# Patient Record
Sex: Male | Born: 1985 | Race: Black or African American | Hispanic: No | Marital: Single | Smoking: Former smoker
Health system: Southern US, Community
[De-identification: ages and names within clinical notes are randomized; demographics above are authoritative.]

## PROBLEM LIST (undated history)

## (undated) DIAGNOSIS — F410 Panic disorder [episodic paroxysmal anxiety] without agoraphobia: Secondary | ICD-10-CM

## (undated) DIAGNOSIS — F419 Anxiety disorder, unspecified: Secondary | ICD-10-CM

## (undated) HISTORY — PX: OTHER SURGICAL HISTORY: SHX169

---

## 2009-09-07 ENCOUNTER — Emergency Department (HOSPITAL_COMMUNITY): Admission: EM | Admit: 2009-09-07 | Discharge: 2009-09-07 | Payer: Self-pay | Admitting: Emergency Medicine

## 2009-09-08 ENCOUNTER — Emergency Department (HOSPITAL_COMMUNITY): Admission: EM | Admit: 2009-09-08 | Discharge: 2009-09-08 | Payer: Self-pay | Admitting: Family Medicine

## 2012-10-01 ENCOUNTER — Encounter (HOSPITAL_COMMUNITY): Payer: Self-pay | Admitting: Family Medicine

## 2012-10-01 ENCOUNTER — Emergency Department (HOSPITAL_COMMUNITY)
Admission: EM | Admit: 2012-10-01 | Discharge: 2012-10-01 | Disposition: A | Discharge status: Home / Self Care | Payer: Self-pay | Attending: Emergency Medicine | Admitting: Emergency Medicine

## 2012-10-01 DIAGNOSIS — K089 Disorder of teeth and supporting structures, unspecified: Secondary | ICD-10-CM | POA: Insufficient documentation

## 2012-10-01 DIAGNOSIS — R220 Localized swelling, mass and lump, head: Secondary | ICD-10-CM | POA: Insufficient documentation

## 2012-10-01 DIAGNOSIS — K029 Dental caries, unspecified: Secondary | ICD-10-CM | POA: Insufficient documentation

## 2012-10-01 DIAGNOSIS — K0889 Other specified disorders of teeth and supporting structures: Secondary | ICD-10-CM

## 2012-10-01 DIAGNOSIS — R6884 Jaw pain: Secondary | ICD-10-CM | POA: Insufficient documentation

## 2012-10-01 DIAGNOSIS — F17200 Nicotine dependence, unspecified, uncomplicated: Secondary | ICD-10-CM | POA: Insufficient documentation

## 2012-10-01 MED ORDER — AMOXICILLIN 500 MG PO CAPS
500.0000 mg | ORAL_CAPSULE | Freq: Once | ORAL | Status: AC
  Administered 2012-10-01: 500 mg via ORAL
  Filled 2012-10-01: qty 1

## 2012-10-01 MED ORDER — TRAMADOL HCL 50 MG PO TABS: 50.0000 mg | ORAL_TABLET | Freq: Four times a day (QID) | ORAL | Status: DC | PRN

## 2012-10-01 MED ORDER — AMOXICILLIN 500 MG PO CAPS: 500.0000 mg | ORAL_CAPSULE | Freq: Three times a day (TID) | ORAL | Status: DC

## 2012-10-01 MED ORDER — TRAMADOL HCL 50 MG PO TABS
50.0000 mg | ORAL_TABLET | Freq: Once | ORAL | Status: AC
  Administered 2012-10-01: 50 mg via ORAL
  Filled 2012-10-01: qty 1

## 2012-10-01 NOTE — ED Notes (Addendum)
Patient states that he has had a toothache for about 3 days. States that he has not seen a dentist because his insurance lapsed.

## 2012-10-01 NOTE — ED Provider Notes (Signed)
History     CSN: [id number]  Arrival date & time 10/01/12  9562   First MD Initiated Contact with Patient 10/01/12 [phone]      No chief complaint on file.   (Consider location/radiation/quality/duration/timing/severity/associated sxs/prior treatment) HPI  Patient presents to the emergency department with a dental complaint. Symptoms began 5 days ago after he chipped his tooth while eating. Then 3 days ago he began to have swelling and pain. The patient has tried to alleviate pain with OTC.  Pain rated at a 10/10, characterized as throbbing in nature and located right lower jaw. Patient denies fever, night sweats, chills, difficulty swallowing or opening mouth, SOB, nuchal rigidity or decreased ROM of neck.  Patient does not have a dentist and requests a resource guide at discharge.   No past medical history on file.  No past surgical history on file.  No family history on file.  History  Substance Use Topics  . Smoking status: Not on file  . Smokeless tobacco: Not on file  . Alcohol Use: Not on file      Review of Systems  All other systems reviewed and are negative.    Allergies  Review of patient's allergies indicates not on file.  Home Medications   Current Outpatient Rx  Name  Route  Sig  Dispense  Refill  . amoxicillin (AMOXIL) 500 MG capsule   Oral   Take 1 capsule (500 mg total) by mouth 3 (three) times daily.   21 capsule   0   . traMADol (ULTRAM) 50 MG tablet   Oral   Take 1 tablet (50 mg total) by mouth every 6 (six) hours as needed for pain.   15 tablet   0     BP 134/75  Pulse 55  Temp(Src) 97.4 F (36.3 C) (Oral)  Resp 18  SpO2 100%  Physical Exam  Nursing note and vitals reviewed. Constitutional: He appears well-developed and well-nourished.  HENT:  Head: Normocephalic and atraumatic.  Mouth/Throat: Dental caries present.    Eyes: Conjunctivae and EOM are normal. Pupils are equal, round, and reactive to light.  Neck: Normal range  of motion. Neck supple.  Cardiovascular: Normal rate and regular rhythm.   Pulmonary/Chest: Effort normal and breath sounds normal.    ED Course  Procedures (including critical care time)  Labs Reviewed - No data to display No results found.   1. Pain, dental       MDM  He declines any narcotic pain medication for fear of getting addicted but accept Ultram. Wants antibiotics. Will give abx and referral to dentist and discussed 24-48 hour rule for f/up with him.  Pt has been advised of the symptoms that warrant their return to the ED. Patient has voiced understanding and has agreed to follow-up with the PCP or specialist.          Dorthula Matas, PA 10/01/12 [phone]

## 2012-10-01 NOTE — ED Provider Notes (Signed)
Medical screening examination/treatment/procedure(s) were performed by non-physician practitioner and as supervising physician I was immediately available for consultation/collaboration.  Olivia Mackie, MD 10/01/12 [phone]

## 2012-11-25 ENCOUNTER — Emergency Department (HOSPITAL_COMMUNITY)
Admission: EM | Admit: 2012-11-25 | Discharge: 2012-11-25 | Disposition: A | Discharge status: Home / Self Care | Payer: Self-pay | Attending: Emergency Medicine | Admitting: Emergency Medicine

## 2012-11-25 ENCOUNTER — Encounter (HOSPITAL_COMMUNITY): Payer: Self-pay | Admitting: *Deleted

## 2012-11-25 ENCOUNTER — Emergency Department (HOSPITAL_COMMUNITY): Payer: Self-pay

## 2012-11-25 DIAGNOSIS — J329 Chronic sinusitis, unspecified: Secondary | ICD-10-CM | POA: Insufficient documentation

## 2012-11-25 DIAGNOSIS — R05 Cough: Secondary | ICD-10-CM | POA: Insufficient documentation

## 2012-11-25 DIAGNOSIS — R0982 Postnasal drip: Secondary | ICD-10-CM | POA: Insufficient documentation

## 2012-11-25 DIAGNOSIS — J019 Acute sinusitis, unspecified: Secondary | ICD-10-CM

## 2012-11-25 DIAGNOSIS — R0602 Shortness of breath: Secondary | ICD-10-CM | POA: Insufficient documentation

## 2012-11-25 DIAGNOSIS — J069 Acute upper respiratory infection, unspecified: Secondary | ICD-10-CM

## 2012-11-25 DIAGNOSIS — R059 Cough, unspecified: Secondary | ICD-10-CM | POA: Insufficient documentation

## 2012-11-25 DIAGNOSIS — J3489 Other specified disorders of nose and nasal sinuses: Secondary | ICD-10-CM | POA: Insufficient documentation

## 2012-11-25 DIAGNOSIS — J029 Acute pharyngitis, unspecified: Secondary | ICD-10-CM | POA: Insufficient documentation

## 2012-11-25 DIAGNOSIS — F17200 Nicotine dependence, unspecified, uncomplicated: Secondary | ICD-10-CM | POA: Insufficient documentation

## 2012-11-25 MED ORDER — HYDROCOD POLST-CHLORPHEN POLST 10-8 MG/5ML PO LQCR: 5.0000 mL | Freq: Two times a day (BID) | ORAL | Status: DC | PRN

## 2012-11-25 MED ORDER — AZITHROMYCIN 250 MG PO TABS: 250.0000 mg | ORAL_TABLET | Freq: Every day | ORAL | Status: DC

## 2012-11-25 NOTE — ED Notes (Signed)
XR at bedside

## 2012-11-25 NOTE — ED Notes (Signed)
Pt from home with reports of chest pain, cough, nasal congestion and shortness of breath for the last week.

## 2012-11-25 NOTE — ED Provider Notes (Signed)
History     CSN: [id number]  Arrival date & time 11/25/12  0800   First MD Initiated Contact with Patient 11/25/12 [phone]      Chief Complaint  Patient presents with  . Chest Pain  . Cough  . Nasal Congestion    (Consider location/radiation/quality/duration/timing/severity/associated sxs/prior treatment) HPI Comments: Patient presents with nasal congestion, sinus pressure, post nasal drip and cough for the past week.  Symptoms gradually worsening.  He has had some associated chest pain for the past 2-3 days intermittently, but only after coughing really hard.  He also reports intermittent shortness of breath.  He is not feeling short of breath at this time.  He has taken Mucinex and Claritin for his symptoms without relief.  No history of Asthma.  He currently smokes occasional cigars.  He denies fever or chills.  No nausea, vomiting, or diarrhea.  Denies dizziness, lightheadedness, or syncope.    The history is provided by the patient.    History reviewed. No pertinent past medical history.  History reviewed. No pertinent past surgical history.  History reviewed. No pertinent family history.  History  Substance Use Topics  . Smoking status: Current Some Day Smoker    Types: Cigars  . Smokeless tobacco: Never Used  . Alcohol Use: Yes     Comment: Socially      Review of Systems  Constitutional: Negative for fever and chills.  HENT: Positive for congestion, sore throat, rhinorrhea, postnasal drip and sinus pressure. Negative for ear pain, neck pain and neck stiffness.   Respiratory: Positive for cough and shortness of breath.   Gastrointestinal: Negative for nausea and vomiting.  All other systems reviewed and are negative.    Allergies  Review of patient's allergies indicates no known allergies.  Home Medications   Current Outpatient Rx  Name  Route  Sig  Dispense  Refill  . amoxicillin (AMOXIL) 500 MG capsule   Oral   Take 1 capsule (500 mg total) by mouth 3  (three) times daily.   21 capsule   0   . traMADol (ULTRAM) 50 MG tablet   Oral   Take 1 tablet (50 mg total) by mouth every 6 (six) hours as needed for pain.   15 tablet   0     BP 140/88  Pulse 60  Temp(Src) 98 F (36.7 C) (Oral)  Resp 16  Wt 206 lb (93.441 kg)  SpO2 100%  Physical Exam  Nursing note and vitals reviewed. Constitutional: He appears well-developed and well-nourished.  HENT:  Head: Normocephalic and atraumatic.  Right Ear: Tympanic membrane and ear canal normal.  Left Ear: Tympanic membrane and ear canal normal.  Nose: Mucosal edema and rhinorrhea present. Right sinus exhibits maxillary sinus tenderness. Right sinus exhibits no frontal sinus tenderness. Left sinus exhibits maxillary sinus tenderness. Left sinus exhibits no frontal sinus tenderness.  Mouth/Throat: Uvula is midline, oropharynx is clear and moist and mucous membranes are normal.  Neck: Normal range of motion. Neck supple.  Cardiovascular: Normal rate, regular rhythm and normal heart sounds.   Pulmonary/Chest: Effort normal and breath sounds normal. No respiratory distress. He has no wheezes. He has no rales. He exhibits tenderness.  Neurological: He is alert.  Skin: Skin is warm and dry.  Psychiatric: He has a normal mood and affect.    ED Course  Procedures (including critical care time)  Labs Reviewed  CBC  BASIC METABOLIC PANEL   Dg Chest Port 1 View  11/25/2012  *RADIOLOGY REPORT*  Clinical Data: Chest pain.  Cough.  PORTABLE CHEST - 1 VIEW  Comparison: 09/07/2009.  Findings: Heart size upper limits of normal for projection.  No airspace disease.  No effusion. No pneumothorax. Monitoring leads are projected over the chest.  IMPRESSION: No acute cardiopulmonary disease.   Original Report Authenticated By: Andreas Newport, M.D.      No diagnosis found.   Date: 11/25/2012  Rate: 56  Rhythm: sinus bradycardia  QRS Axis: normal  Intervals: normal  ST/T Wave abnormalities: normal   Conduction Disutrbances:none  Narrative Interpretation:   Old EKG Reviewed: none available    MDM  Pt CXR negative for acute infiltrate. Patients symptoms are consistent with URI, likely viral etiology. Patient also with sinus tenderness consistent with Acute Sinusitis.  Patient discharged home with antibiotic to treat Sinusitis and cough suppressant.  Patient verbalizes understanding and is agreeable with plan. Pt is hemodynamically stable & in NAD prior to dc.        Pascal Lux [place], PA-C 11/25/12 [phone]

## 2012-11-25 NOTE — ED Provider Notes (Signed)
Medical screening examination/treatment/procedure(s) were performed by non-physician practitioner and as supervising physician I was immediately available for consultation/collaboration.  Flint Melter, MD 11/25/12 1053

## 2013-06-18 ENCOUNTER — Encounter (HOSPITAL_COMMUNITY): Payer: Self-pay | Admitting: Emergency Medicine

## 2013-06-18 ENCOUNTER — Emergency Department (HOSPITAL_COMMUNITY)
Admission: EM | Admit: 2013-06-18 | Discharge: 2013-06-18 | Disposition: A | Discharge status: Home / Self Care | Payer: Managed Care, Other (non HMO) | Attending: Emergency Medicine | Admitting: Emergency Medicine

## 2013-06-18 DIAGNOSIS — X500XXA Overexertion from strenuous movement or load, initial encounter: Secondary | ICD-10-CM | POA: Insufficient documentation

## 2013-06-18 DIAGNOSIS — Y9389 Activity, other specified: Secondary | ICD-10-CM | POA: Insufficient documentation

## 2013-06-18 DIAGNOSIS — F17200 Nicotine dependence, unspecified, uncomplicated: Secondary | ICD-10-CM | POA: Insufficient documentation

## 2013-06-18 DIAGNOSIS — Z79899 Other long term (current) drug therapy: Secondary | ICD-10-CM | POA: Insufficient documentation

## 2013-06-18 DIAGNOSIS — Y9289 Other specified places as the place of occurrence of the external cause: Secondary | ICD-10-CM | POA: Insufficient documentation

## 2013-06-18 DIAGNOSIS — IMO0002 Reserved for concepts with insufficient information to code with codable children: Secondary | ICD-10-CM | POA: Insufficient documentation

## 2013-06-18 DIAGNOSIS — M549 Dorsalgia, unspecified: Secondary | ICD-10-CM

## 2013-06-18 DIAGNOSIS — Y990 Civilian activity done for income or pay: Secondary | ICD-10-CM | POA: Insufficient documentation

## 2013-06-18 MED ORDER — MELOXICAM 7.5 MG PO TABS: 15.0000 mg | ORAL_TABLET | Freq: Every day | ORAL | Status: DC

## 2013-06-18 MED ORDER — CYCLOBENZAPRINE HCL 10 MG PO TABS: 10.0000 mg | ORAL_TABLET | Freq: Two times a day (BID) | ORAL | Status: DC | PRN

## 2013-06-18 MED ORDER — OXYCODONE-ACETAMINOPHEN 5-325 MG PO TABS
2.0000 | ORAL_TABLET | Freq: Once | ORAL | Status: AC
  Administered 2013-06-18: 2 via ORAL
  Filled 2013-06-18: qty 2

## 2013-06-18 NOTE — ED Notes (Signed)
Pt states that he lifted something heavy at work and heard a pop in his lower back

## 2013-06-18 NOTE — ED Provider Notes (Signed)
Medical screening examination/treatment/procedure(s) were performed by non-physician practitioner and as supervising physician I was immediately available for consultation/collaboration.    Sunnie Nielsen, MD 06/18/13 [phone]

## 2013-06-18 NOTE — ED Provider Notes (Signed)
CSN: [id number]     Arrival date & time 06/18/13  4098 History   First MD Initiated Contact with Patient 06/18/13 [phone]     Chief Complaint  Patient presents with  . Back Pain   (Consider location/radiation/quality/duration/timing/severity/associated sxs/prior Treatment) HPI Comments: Patient is a 27 year old male with no significant past medical history who presents for low back pain with onset 1.5 days ago. Patient states that he was lifting packages at work when he heard a pop in his back followed by a pain. Patient states the pain has been worsening since onset and is intermittent. He denies trying anything or taking any over-the-counter medication for his symptoms. Patient states, "all I tried is lying down". Patient denies associated fever, saddle anesthesia, perianal numbness, bowel or bladder incontinence, numbness or weakness in his lower extremities, and an inability to ambulate.  The history is provided by the patient. No language interpreter was used.    History reviewed. No pertinent past medical history. History reviewed. No pertinent past surgical history. No family history on file. History  Substance Use Topics  . Smoking status: Current Some Day Smoker    Types: Cigars  . Smokeless tobacco: Never Used  . Alcohol Use: Yes     Comment: Socially    Review of Systems  Musculoskeletal: Positive for back pain.  All other systems reviewed and are negative.    Allergies  Review of patient's allergies indicates no known allergies.  Home Medications   Current Outpatient Rx  Name  Route  Sig  Dispense  Refill  . cyclobenzaprine (FLEXERIL) 10 MG tablet   Oral   Take 1 tablet (10 mg total) by mouth 2 (two) times daily as needed for muscle spasms.   20 tablet   0   . meloxicam (MOBIC) 7.5 MG tablet   Oral   Take 2 tablets (15 mg total) by mouth daily.   30 tablet   0    BP 121/71  Pulse 71  Temp(Src) 97.8 F (36.6 C) (Oral)  Resp 18  SpO2 98%  Physical  Exam  Nursing note and vitals reviewed. Constitutional: He is oriented to person, place, and time. He appears well-developed and well-nourished. No distress.  HENT:  Head: Normocephalic and atraumatic.  Eyes: Conjunctivae and EOM are normal. No scleral icterus.  Neck: Normal range of motion.  Cardiovascular: Normal rate, regular rhythm and intact distal pulses.   Pulmonary/Chest: Effort normal. No respiratory distress.  Musculoskeletal: Normal range of motion. He exhibits tenderness.  Tenderness to palpation of bilateral lumbosacral paraspinal muscles. No bony deformities or step-offs palpated. Normal range of motion of back appreciated.  Neurological: He is alert and oriented to person, place, and time.  No sensory or motor deficits appreciated. Patient moves extremities without ataxia and is ambulatory with normal gait.  Skin: Skin is warm and dry. No rash noted. He is not diaphoretic. No erythema. No pallor.  Psychiatric: He has a normal mood and affect. His behavior is normal.    ED Course  Procedures (including critical care time) Labs Review Labs Reviewed - No data to display Imaging Review No results found.  EKG Interpretation   None       MDM   1. Back pain    Uncomplicated back pain. Patient neurovascularly intact and ambulatory with normal gait. No bony deformities or step-offs palpated. Patient without red flags or signs concerning for cauda equina. He is afebrile and hemodynamically stable today. Patient's pain treated in ED with Percocet. He is  stable for discharge with prescriptions for Mobic and Flexeril and instruction to ice the area. Referral to orthopedics provided to symptoms persist. Return precautions discussed and patient agreeable to plan with no unaddressed concerns.    Antony Madura, PA-C 06/18/13 [phone]

## 2013-06-18 NOTE — ED Notes (Signed)
Pt lifting packages at work tonight and felt like he twisted back; pain getting worse

## 2013-06-18 NOTE — Discharge Instructions (Signed)
Recommend Mobic and Flexeril as prescribed for your symptoms. Also recommend ice to your back at least 4 times a day for at least 30 minutes each time for the next 72 hours. Refrain from strenuous activity and heavy lifting for the next 7 days. Follow up with orthopedics if symptoms persist. Return if symptoms worsen.  Back Pain, Adult Low back pain is very common. About 1 in 5 people have back pain.The cause of low back pain is rarely dangerous. The pain often gets better over time.About half of people with a sudden onset of back pain feel better in just 2 weeks. About 8 in 10 people feel better by 6 weeks.  CAUSES Some common causes of back pain include:  Strain of the muscles or ligaments supporting the spine.  Wear and tear (degeneration) of the spinal discs.  Arthritis.  Direct injury to the back. DIAGNOSIS Most of the time, the direct cause of low back pain is not known.However, back pain can be treated effectively even when the exact cause of the pain is unknown.Answering your caregiver's questions about your overall health and symptoms is one of the most accurate ways to make sure the cause of your pain is not dangerous. If your caregiver needs more information, he or she may order lab work or imaging tests (X-rays or MRIs).However, even if imaging tests show changes in your back, this usually does not require surgery. HOME CARE INSTRUCTIONS For many people, back pain returns.Since low back pain is rarely dangerous, it is often a condition that people can learn to [facility] their own.   Remain active. It is stressful on the back to sit or stand in one place. Do not sit, drive, or stand in one place for more than 30 minutes at a time. Take short walks on level surfaces as soon as pain allows.Try to increase the length of time you walk each day.  Do not stay in bed.Resting more than 1 or 2 days can delay your recovery.  Do not avoid exercise or work.Your body is made to move.It  is not dangerous to be active, even though your back may hurt.Your back will likely heal faster if you return to being active before your pain is gone.  Pay attention to your body when you bend and lift. Many people have less discomfortwhen lifting if they bend their knees, keep the load close to their bodies,and avoid twisting. Often, the most comfortable positions are those that put less stress on your recovering back.  Find a comfortable position to sleep. Use a firm mattress and lie on your side with your knees slightly bent. If you lie on your back, put a pillow under your knees.  Only take over-the-counter or prescription medicines as directed by your caregiver. Over-the-counter medicines to reduce pain and inflammation are often the most helpful.Your caregiver may prescribe muscle relaxant drugs.These medicines help dull your pain so you can more quickly return to your normal activities and healthy exercise.  Put ice on the injured area.  Put ice in a plastic bag.  Place a towel between your skin and the bag.  Leave the ice on for 15-20 minutes, 03-04 times a day for the first 2 to 3 days. After that, ice and heat may be alternated to reduce pain and spasms.  Ask your caregiver about trying back exercises and gentle massage. This may be of some benefit.  Avoid feeling anxious or stressed.Stress increases muscle tension and can worsen back pain.It is important to  recognize when you are anxious or stressed and learn ways to manage it.Exercise is a great option. SEEK MEDICAL CARE IF:  You have pain that is not relieved with rest or medicine.  You have pain that does not improve in 1 week.  You have new symptoms.  You are generally not feeling well. SEEK IMMEDIATE MEDICAL CARE IF:   You have pain that radiates from your back into your legs.  You develop new bowel or bladder control problems.  You have unusual weakness or numbness in your arms or legs.  You develop  nausea or vomiting.  You develop abdominal pain.  You feel faint. Document Released: 07/22/2005 Document Revised: 01/21/2012 Document Reviewed: 12/10/2010 [facility] Patient Information 2014 [place], [place].

## 2013-06-19 ENCOUNTER — Emergency Department (HOSPITAL_COMMUNITY)
Admission: EM | Admit: 2013-06-19 | Discharge: 2013-06-19 | Disposition: A | Discharge status: Home / Self Care | Payer: Managed Care, Other (non HMO) | Attending: Emergency Medicine | Admitting: Emergency Medicine

## 2013-06-19 ENCOUNTER — Encounter (HOSPITAL_COMMUNITY): Payer: Self-pay | Admitting: Emergency Medicine

## 2013-06-19 DIAGNOSIS — F17200 Nicotine dependence, unspecified, uncomplicated: Secondary | ICD-10-CM | POA: Insufficient documentation

## 2013-06-19 DIAGNOSIS — R51 Headache: Secondary | ICD-10-CM | POA: Insufficient documentation

## 2013-06-19 DIAGNOSIS — M545 Low back pain, unspecified: Secondary | ICD-10-CM | POA: Insufficient documentation

## 2013-06-19 DIAGNOSIS — IMO0001 Reserved for inherently not codable concepts without codable children: Secondary | ICD-10-CM | POA: Insufficient documentation

## 2013-06-19 DIAGNOSIS — Z79899 Other long term (current) drug therapy: Secondary | ICD-10-CM | POA: Insufficient documentation

## 2013-06-19 NOTE — ED Notes (Signed)
Per pt report: pt was seen here on Thursday and was given a prescription for pain medication that he cannot take and work.  Pt originally injured back from picking up heavy boxes.  Pt reports still in pain.

## 2013-06-19 NOTE — ED Provider Notes (Signed)
CSN: [id number]     Arrival date & time 06/19/13  2210 History  This chart was scribed for non-physician practitioner Trixie Dredge, PA-C,  working with Celene Kras, MD, by Yevette Edwards, ED Scribe. This patient was seen in room WTR9/WTR9 and the patient's care was started at 10:42 PM. First MD Initiated Contact with Patient 06/19/13 2237     Chief Complaint  Patient presents with  . Back Pain    Patient is a 27 y.o. male presenting with back pain. The history is provided by the patient. No language interpreter was used.  Back Pain Location:  Lumbar spine Radiates to:  Does not radiate Pain severity:  Moderate Onset quality:  Sudden Duration:  3 days Timing:  Constant Progression:  Unchanged Chronicity:  Recurrent Context: lifting heavy objects   Associated symptoms: headaches   Associated symptoms: no fever, no numbness and no weakness    HPI Comments: Todd Riggs is a 27 y.o. male who presents to the Emergency Department complaining of recurrent middle-back pain which occurred two days ago when he picked up a heavy box and heard a "pop." After the incident, the pt was treated for the back pain at the ED and he was prescribed medication. He reports the medication causes drowsiness. The pt returned to work today where he experienced increased pain; he requests a work note in order to give his back time to heal. He states that he has experienced intermittent paresthesia to his left leg. He denies any weakness to his legs. He also denies any urinary incontinence, bowel incontinence, difficulty ambulating, or a fever.   History reviewed. No pertinent past medical history. History reviewed. No pertinent past surgical history. No family history on file. History  Substance Use Topics  . Smoking status: Current Some Day Smoker    Types: Cigars  . Smokeless tobacco: Never Used  . Alcohol Use: Yes     Comment: Socially    Review of Systems  Constitutional: Negative for fever.   Genitourinary: Negative for urgency.  Musculoskeletal: Positive for back pain and myalgias. Negative for gait problem.  Neurological: Positive for headaches. Negative for weakness and numbness.  All other systems reviewed and are negative.    Allergies  Review of patient's allergies indicates no known allergies.  Home Medications   Current Outpatient Rx  Name  Route  Sig  Dispense  Refill  . cyclobenzaprine (FLEXERIL) 10 MG tablet   Oral   Take 1 tablet (10 mg total) by mouth 2 (two) times daily as needed for muscle spasms.   20 tablet   0   . meloxicam (MOBIC) 7.5 MG tablet   Oral   Take 2 tablets (15 mg total) by mouth daily.   30 tablet   0    Triage Vitals: BP 120/71  Pulse 74  Temp(Src) 98.2 F (36.8 C) (Oral)  Resp 18  Ht 6\' 2"  (1.88 m)  Wt 216 lb (97.977 kg)  BMI 27.72 kg/m2  SpO2 99%  Physical Exam  Nursing note and vitals reviewed. Constitutional: He appears well-developed and well-nourished. No distress.  HENT:  Head: Normocephalic and atraumatic.  Neck: Neck supple.  Pulmonary/Chest: Effort normal.  Abdominal: Soft. There is no tenderness.  Musculoskeletal: Normal range of motion. He exhibits tenderness. He exhibits no edema.       Arms: Spine nontender, no crepitus, or stepoffs. Lower extremities:  Strength 5/5, sensation intact, distal pulses intact.      Neurological: He is alert.  Skin: He  is not diaphoretic.    ED Course  Procedures (including critical care time)  DIAGNOSTIC STUDIES: Oxygen Saturation is 99% on room air, normal by my interpretation.    COORDINATION OF CARE:  10:48 PM- Discussed treatment plan with patient, and the patient agreed to the plan.   Labs Review Labs Reviewed - No data to display Imaging Review No results found.  EKG Interpretation   None       MDM   1. Low back pain    Patient with low back pain that occurred during heavy lifting. Patient has 28 been seen in the emergency department for the  same. He has pain medications at home that are working well. He returned today to request a work note to extend past the one dated he was given. 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 Patient advised to use the followup provided from his last visit. No red flags for back pain.  Discussed  findings, treatment, and follow up  with patient.  Pt given return precautions.  Pt verbalizes understanding and agrees with plan.      I personally performed the services described in this documentation, which was scribed in my presence. The recorded information has been reviewed and is accurate.    Trixie Dredge, PA-C 06/20/13 [phone]

## 2013-06-23 NOTE — ED Provider Notes (Signed)
Medical screening examination/treatment/procedure(s) were performed by non-physician practitioner and as supervising physician I was immediately available for consultation/collaboration.    Celene Kras, MD 06/23/13 [phone]

## 2014-05-02 ENCOUNTER — Emergency Department (HOSPITAL_COMMUNITY)
Admission: EM | Admit: 2014-05-02 | Discharge: 2014-05-02 | Disposition: A | Discharge status: Home / Self Care | Payer: Managed Care, Other (non HMO) | Attending: Emergency Medicine | Admitting: Emergency Medicine

## 2014-05-02 ENCOUNTER — Encounter (HOSPITAL_COMMUNITY): Payer: Self-pay | Admitting: Emergency Medicine

## 2014-05-02 DIAGNOSIS — H9209 Otalgia, unspecified ear: Secondary | ICD-10-CM | POA: Insufficient documentation

## 2014-05-02 DIAGNOSIS — R05 Cough: Secondary | ICD-10-CM | POA: Insufficient documentation

## 2014-05-02 DIAGNOSIS — R0981 Nasal congestion: Secondary | ICD-10-CM

## 2014-05-02 DIAGNOSIS — Z791 Long term (current) use of non-steroidal anti-inflammatories (NSAID): Secondary | ICD-10-CM | POA: Insufficient documentation

## 2014-05-02 DIAGNOSIS — K089 Disorder of teeth and supporting structures, unspecified: Secondary | ICD-10-CM | POA: Insufficient documentation

## 2014-05-02 DIAGNOSIS — F17200 Nicotine dependence, unspecified, uncomplicated: Secondary | ICD-10-CM | POA: Insufficient documentation

## 2014-05-02 DIAGNOSIS — K029 Dental caries, unspecified: Secondary | ICD-10-CM

## 2014-05-02 DIAGNOSIS — G8929 Other chronic pain: Secondary | ICD-10-CM | POA: Insufficient documentation

## 2014-05-02 DIAGNOSIS — J3489 Other specified disorders of nose and nasal sinuses: Secondary | ICD-10-CM | POA: Insufficient documentation

## 2014-05-02 DIAGNOSIS — K006 Disturbances in tooth eruption: Secondary | ICD-10-CM | POA: Insufficient documentation

## 2014-05-02 DIAGNOSIS — R059 Cough, unspecified: Secondary | ICD-10-CM | POA: Insufficient documentation

## 2014-05-02 MED ORDER — DOXYCYCLINE HYCLATE 100 MG PO CAPS: 100.0000 mg | ORAL_CAPSULE | Freq: Two times a day (BID) | ORAL | Status: DC

## 2014-05-02 MED ORDER — HYDROCODONE-ACETAMINOPHEN 5-325 MG PO TABS: 1.0000 | ORAL_TABLET | Freq: Four times a day (QID) | ORAL | Status: DC | PRN

## 2014-05-02 MED ORDER — SODIUM CHLORIDE-SODIUM BICARB 2300-700 MG NA KIT: 1.0000 "application " | PACK | Freq: Three times a day (TID) | NASAL | Status: DC | PRN

## 2014-05-02 MED ORDER — FLUTICASONE PROPIONATE 50 MCG/ACT NA SUSP: 2.0000 | Freq: Every day | NASAL | Status: DC

## 2014-05-02 MED ORDER — NAPROXEN 500 MG PO TABS: 500.0000 mg | ORAL_TABLET | Freq: Two times a day (BID) | ORAL | Status: DC | PRN

## 2014-05-02 NOTE — Progress Notes (Signed)
  CARE MANAGEMENT ED NOTE 05/02/2014  Patient:  Todd Riggs, Todd Riggs   Account Number:  [id number]  Date Initiated:  05/02/2014  Documentation initiated by:  Radford Pax  Subjective/Objective Assessment:   Patient presents to Ed with nasal congestion and tooth pain     Subjective/Objective Assessment Detail:     Action/Plan:   Action/Plan Detail:   Anticipated DC Date:  05/02/2014     Status Recommendation to Physician:   Result of Recommendation:    Other ED Services  Consult Working Plan    DC Planning Services  Other  PCP issues    Choice offered to / List presented to:            Status of service:  Completed, signed off  ED Comments:   ED Comments Detail:  EDCM spoke to patient at bedside.  Patient confirms he has [place] insurance but insurance has lapsed dur to patient is on [place] leave.  Patient reports his insurance should be reinstated in a month.  [facility] provided patient with pamphlet to [facility], informed patient of services and walk in times for clinic.  EDCM also provided patient with list of pcps who accept self pay patients, list of discounted pharmacies and websites needymeds.org and goodrx.com for medication assistance, list of financial reosurces in the community such as local churches,, [employer], [employer], and dental assistance for patients without insurance.  EDCM instructed patient to call the phone number on the back of his insurance card to help him find a pcp who is close to him and within network.  Patient thankful for resources. No further EDCM needs at this time.

## 2014-05-02 NOTE — ED Notes (Signed)
Pt c/o upper and lower left dental pain due to cracked teeth for about week. Pt also c/o nasal congestion that drains down back of his throat for 2 days.

## 2014-05-02 NOTE — ED Provider Notes (Signed)
CSN: [id number]     Arrival date & time 05/02/14  1415 History  This chart was scribed for non-physician practitioner, Zacarias Pontes, PA-C,working with Debby Freiberg, MD, by Marlowe Kays, ED Scribe. This patient was seen in room WTR7/WTR7 and the patient's care was started at 4:59 PM.  Chief Complaint  Patient presents with  . Dental Pain  . Nasal Congestion   Patient is a 28 y.o. male presenting with tooth pain. The history is provided by the patient. No language interpreter was used.  Dental Pain Location:  Upper and lower Quality:  Sharp Severity:  Severe Onset quality:  Gradual Progression:  Waxing and waning Chronicity:  Recurrent Context: dental fracture and poor dentition   Relieved by:  Nothing Worsened by:  Cold food/drink Associated symptoms: congestion   Associated symptoms: no drooling, no facial pain, no facial swelling, no fever, no headaches and no trismus   Congestion:    Location:  Nasal  HPI Comments:  Zia Kanner is a 28 y.o. male with no significant PMHx who presents to the Emergency Department complaining of waxing and waning severe, stabbing L sided upper and lower dental pain that began approximately nine days ago. Pt reports the pain as 10/10. He states he broke one tooth on the top and one tooth on the bottom about two months ago. Pt states he has had some drainage from the upper tooth. He states he took some left over PCN with some relief of the pain. He has taken Ibuprofen with no significant relief of the pain. He states the pain increases when air hits the area or he chews on that side. He also reports some nasal congestion that started two days ago. He reports clear rhinorrhea, post nasal drip and some mild pressure-like pain of the left ear. Pt reports mild associated nonproductive cough. He has taken Mucinex, Claritin, DayQuil, and another unknown OTC allergy medication with no relief of the congestion. He denies sore throat, drooling, trismus,  difficulty swallowing, fever, chills, HA, CP or SOB. Denies any other complaints at this time.  History reviewed. No pertinent past medical history. History reviewed. No pertinent past surgical history. No family history on file. History  Substance Use Topics  . Smoking status: Current Some Day Smoker    Types: Cigars  . Smokeless tobacco: Never Used  . Alcohol Use: Yes     Comment: Socially    Review of Systems  Constitutional: Negative for fever and chills.  HENT: Positive for congestion, dental problem, ear pain, postnasal drip, rhinorrhea and sinus pressure. Negative for drooling, ear discharge, facial swelling, sore throat, tinnitus and trouble swallowing.   Eyes: Negative for discharge.  Respiratory: Positive for cough (mild nonproductive). Negative for shortness of breath and wheezing.   Cardiovascular: Negative for chest pain.  Gastrointestinal: Negative for nausea, vomiting and abdominal pain.  Musculoskeletal: Negative for arthralgias and myalgias.  Skin: Negative for color change.  Neurological: Negative for weakness, numbness and headaches.   10 Systems reviewed and are negative for acute change except as noted in the HPI.   Allergies  Review of patient's allergies indicates no known allergies.  Home Medications   Prior to Admission medications   Medication Sig Start Date End Date Taking? Authorizing Provider  cyclobenzaprine (FLEXERIL) 10 MG tablet Take 1 tablet (10 mg total) by mouth 2 (two) times daily as needed for muscle spasms. 06/18/13   Antonietta Breach, PA-C  meloxicam (MOBIC) 7.5 MG tablet Take 2 tablets (15 mg total) by mouth daily.  06/18/13   Antonietta Breach, PA-C   Triage Vitals: BP 134/79  Pulse 57  Temp(Src) 98.2 F (36.8 C) (Oral)  Resp 18  SpO2 100% Physical Exam  Nursing note and vitals reviewed. Constitutional: He is oriented to person, place, and time. Vital signs are normal. He appears well-developed and well-nourished.  Non-toxic appearance. No  distress.  Afebrile, nontoxic, handling secretions well  HENT:  Head: Normocephalic and atraumatic.  Right Ear: Hearing, tympanic membrane, external ear and ear canal normal.  Left Ear: Hearing, tympanic membrane, external ear and ear canal normal.  Nose: Mucosal edema and rhinorrhea present.  Mouth/Throat: Uvula is midline, oropharynx is clear and moist and mucous membranes are normal. No trismus in the jaw. Abnormal dentition. Dental caries present. No uvula swelling. No oropharyngeal exudate.    Poor dentitia throughout, with multiple decayed teeth, L upper bicuspid and L lower molar TTP with caries and possible abscess, no trismus or uvular swelling, oropharynx clear without exudates, no PTA. MMM. Ears clear bilaterally. Nasal turbinates erythematous and edematous with clear rhinorrhea.   Eyes: Conjunctivae and EOM are normal. Pupils are equal, round, and reactive to light.  Neck: Normal range of motion. Neck supple.  Cardiovascular: Normal rate, regular rhythm, normal heart sounds and intact distal pulses.  Exam reveals no gallop and no friction rub.   No murmur heard. Pulmonary/Chest: Effort normal and breath sounds normal. No respiratory distress. He has no decreased breath sounds. He has no wheezes. He has no rhonchi. He has no rales.  CTAB in all lung fields  Abdominal: Soft. Normal appearance and bowel sounds are normal. He exhibits no distension. There is no tenderness. There is no rigidity, no rebound and no guarding.  Musculoskeletal: Normal range of motion.  Lymphadenopathy:       Head (right side): No submandibular adenopathy present.       Head (left side): No submandibular adenopathy present.    He has no cervical adenopathy.  No LAD of head/neck  Neurological: He is alert and oriented to person, place, and time.  Skin: Skin is warm, dry and intact. No erythema.  Psychiatric: He has a normal mood and affect. His behavior is normal.    ED Course  Procedures (including  critical care time) DIAGNOSTIC STUDIES: Oxygen Saturation is 100% on RA, normal by my interpretation.   COORDINATION OF CARE: 5:11 PM- Advised the pt to use a Neti-Pot and will prescribe Flonase. Offered pt a dental block but he refused. Will prescribe pain medication and antibiotics. Will refer to dentist. Pt verbalizes understanding and agrees to plan.   Labs Review Labs Reviewed - No data to display  Imaging Review No results found.   EKG Interpretation None      MDM   Final diagnoses:  Chronic dental pain  Nasal congestion  Dental decay    27y/o male with dental pain associated with dental decay and possible dental abscess with patient afebrile, non toxic appearing and swallowing secretions well. Declined dental block. I gave patient referral to dentist and stressed the importance of dental follow up for ultimate management of dental pain.  I have also discussed reasons to return immediately to the ER.  Patient expresses understanding and agrees with plan.  I will also give doxycycline and pain control. For URI, likely viral, afebrile and low CENTOR criteria, will treat symptomatically with flonase, netipot, and outpatient follow up. Discussed staying well hydrated. I explained the diagnosis and have given explicit precautions to return to the ER including  for any other new or worsening symptoms. The patient understands and accepts the medical plan as it's been dictated and I have answered their questions. Discharge instructions concerning home care and prescriptions have been given. The patient is STABLE and is discharged to home in good condition.   I personally performed the services described in this documentation, which was scribed in my presence. The recorded information has been reviewed and is accurate.  BP 134/79  Pulse 57  Temp(Src) 98.2 F (36.8 C) (Oral)  Resp 18  SpO2 100%  Meds ordered this encounter  Medications  . naproxen (NAPROSYN) 500 MG tablet    Sig:  Take 1 tablet (500 mg total) by mouth 2 (two) times daily as needed for mild pain, moderate pain or headache (TAKE WITH MEALS.).    Dispense:  20 tablet    Refill:  0    Order Specific Question:  Supervising Provider    Answer:  Noemi Chapel D [[id number]]  . HYDROcodone-acetaminophen (NORCO) 5-325 MG per tablet    Sig: Take 1-2 tablets by mouth every 6 (six) hours as needed for severe pain.    Dispense:  10 tablet    Refill:  0    Order Specific Question:  Supervising Provider    Answer:  Noemi Chapel D [[id number]]  . doxycycline (VIBRAMYCIN) 100 MG capsule    Sig: Take 1 capsule (100 mg total) by mouth 2 (two) times daily. One po bid x 7 days    Dispense:  14 capsule    Refill:  0    Order Specific Question:  Supervising Provider    Answer:  Noemi Chapel D [[id number]]  . fluticasone (FLONASE) 50 MCG/ACT nasal spray    Sig: Place 2 sprays into both nostrils daily.    Dispense:  16 g    Refill:  0    Order Specific Question:  Supervising Provider    Answer:  Noemi Chapel D [[id number]]  . Sodium Chloride-Sodium Bicarb (NETI POT SINUS WASH) 2300-700 MG KIT    Sig: Place 1 application into the nose 3 (three) times daily as needed (sinus congestion).    Dispense:  1 each    Refill:  2    Order Specific Question:  Supervising Provider    Answer:  Noemi Chapel D [[id number]]     Daichi Moris Gershon Mussel Camprubi-Soms, PA-C 05/02/14 1800

## 2014-05-02 NOTE — Discharge Instructions (Signed)
Continue to stay well-hydrated. Gargle warm salt water and spit it out. May consider over-the-counter claritin for additional relief. Use flonase and neti pot to help with nasal congestion. For your dental pain, Apply warm compresses to jaw throughout the day. Take doxycycline until finished and AVOID SUNLIGHT! TAKE THIS UNTIL YOU'RE DONE, DON'T STOP EARLY! Take naprosyn and norco as directed, as needed for pain but do not drive or operate machinery with pain medication use. Followup with a dentist is very important for ongoing evaluation and management of recurrent dental pain. Followup with your primary care doctor in 5-7 days for recheck of ongoing upper respiratory infection symptoms. Return to emergency department for emergent changing or worsening of symptoms.   Dental Caries Dental caries is tooth decay. This decay can cause a hole in teeth (cavity) that can get bigger and deeper over time. HOME CARE  Brush and floss your teeth. Do this at least two times a day.  Use a fluoride toothpaste.  Use a mouth rinse if told by your dentist or doctor.  Eat less sugary and starchy foods. Drink less sugary drinks.  Avoid snacking often on sugary and starchy foods. Avoid sipping often on sugary drinks.  Keep regular checkups and cleanings with your dentist.  Use fluoride supplements if told by your dentist or doctor.  Allow fluoride to be applied to teeth if told by your dentist or doctor. Document Released: 04/30/2008 Document Revised: 12/06/2013 Document Reviewed: 07/24/2012 [facility] Patient Information 2015 [place], [place]. This information is not intended to replace advice given to you by your health care provider. Make sure you discuss any questions you have with your health care provider.  Dental Pain Toothache is pain in or around a tooth. It may get worse with chewing or with cold or heat.  HOME CARE  Your dentist may use a numbing medicine during treatment. If so, you may need to  avoid eating until the medicine wears off. Ask your dentist about this.  Only take medicine as told by your dentist or doctor.  Avoid chewing food near the painful tooth until after all treatment is done. Ask your dentist about this. GET HELP RIGHT AWAY IF:   The problem gets worse or new problems appear.  You have a fever.  There is redness and puffiness (swelling) of the face, jaw, or neck.  You cannot open your mouth.  There is pain in the jaw.  There is very bad pain that is not helped by medicine. MAKE SURE YOU:   Understand these instructions.  Will watch your condition.  Will get help right away if you are not doing well or get worse. Document Released: 01/08/2008 Document Revised: 10/14/2011 Document Reviewed: 01/08/2008 [facility] Patient Information 2015 [place], [place]. This information is not intended to replace advice given to you by your health care provider. Make sure you discuss any questions you have with your health care provider.  Dental Care and Dentist Visits Dental care supports good overall health. Regular dental visits can also help you avoid dental pain, bleeding, infection, and other more serious health problems in the future. It is important to keep the mouth healthy because diseases in the teeth, gums, and other oral tissues can spread to other areas of the body. Some problems, such as diabetes, heart disease, and pre-term labor have been associated with poor oral health.  See your dentist every 6 months. If you experience emergency problems such as a toothache or broken tooth, go to the dentist right away.  If you see your dentist regularly, you may catch problems early. It is easier to be treated for problems in the early stages.  WHAT TO EXPECT AT A DENTIST VISIT  Your dentist will look for many common oral health problems and recommend proper treatment. At your regular dental visit, you can expect:  Gentle cleaning of the teeth and gums. This includes  scraping and polishing. This helps to remove the sticky substance around the teeth and gums (plaque). Plaque forms in the mouth shortly after eating. Over time, plaque hardens on the teeth as tartar. If tartar is not removed regularly, it can cause problems. Cleaning also helps remove stains.  Periodic X-rays. These pictures of the teeth and supporting bone will help your dentist assess the health of your teeth.  Periodic fluoride treatments. Fluoride is a natural mineral shown to help strengthen teeth. Fluoride treatmentinvolves applying a fluoride gel or varnish to the teeth. It is most commonly done in children.  Examination of the mouth, tongue, jaws, teeth, and gums to look for any oral health problems, such as:  Cavities (dental caries). This is decay on the tooth caused by plaque, sugar, and acid in the mouth. It is best to catch a cavity when it is small.  Inflammation of the gums caused by plaque buildup (gingivitis).  Problems with the mouth or malformed or misaligned teeth.  Oral cancer or other diseases of the soft tissues or jaws. KEEP YOUR TEETH AND GUMS HEALTHY For healthy teeth and gums, follow these general guidelines as well as your dentist's specific advice:  Have your teeth professionally cleaned at the dentist every 6 months.  Brush twice daily with a fluoride toothpaste.  Floss your teeth daily.  Ask your dentist if you need fluoride supplements, treatments, or fluoride toothpaste.  Eat a healthy diet. Reduce foods and drinks with added sugar.  Avoid smoking. TREATMENT FOR ORAL HEALTH PROBLEMS If you have oral health problems, treatment varies depending on the conditions present in your teeth and gums.  Your caregiver will most likely recommend good oral hygiene at each visit.  For cavities, gingivitis, or other oral health disease, your caregiver will perform a procedure to treat the problem. This is typically done at a separate appointment. Sometimes your  caregiver will refer you to another dental specialist for specific tooth problems or for surgery. SEEK IMMEDIATE DENTAL CARE IF:  You have pain, bleeding, or soreness in the gum, tooth, jaw, or mouth area.  A permanent tooth becomes loose or separated from the gum socket.  You experience a blow or injury to the mouth or jaw area. Document Released: 04/03/2011 Document Revised: 10/14/2011 Document Reviewed: 04/03/2011 [facility] Patient Information 2015 [place], [place]. This information is not intended to replace advice given to you by your health care provider. Make sure you discuss any questions you have with your health care provider.  Upper Respiratory Infection, Adult An upper respiratory infection (URI) is also known as the common cold. It is often caused by a type of germ (virus). Colds are easily spread (contagious). You can pass it to others by kissing, coughing, sneezing, or drinking out of the same glass. Usually, you get better in 1 or 2 weeks.  HOME CARE   Only take medicine as told by your doctor.  Use a warm mist humidifier or breathe in steam from a hot shower.  Drink enough water and fluids to keep your pee (urine) clear or pale yellow.  Get plenty of rest.  Return to  work when your temperature is back to normal or as told by your doctor. You may use a face mask and wash your hands to stop your cold from spreading. GET HELP RIGHT AWAY IF:   After the first few days, you feel you are getting worse.  You have questions about your medicine.  You have chills, shortness of breath, or brown or red spit (mucus).  You have yellow or brown snot (nasal discharge) or pain in the face, especially when you bend forward.  You have a fever, puffy (swollen) neck, pain when you swallow, or white spots in the back of your throat.  You have a bad headache, ear pain, sinus pain, or chest pain.  You have a high-pitched whistling sound when you breathe in and out (wheezing).  You have  a lasting cough or cough up blood.  You have sore muscles or a stiff neck. MAKE SURE YOU:   Understand these instructions.  Will watch your condition.  Will get help right away if you are not doing well or get worse. Document Released: 01/08/2008 Document Revised: 10/14/2011 Document Reviewed: 10/27/2013 [facility] Patient Information 2015 [place], [place]. This information is not intended to replace advice given to you by your health care provider. Make sure you discuss any questions you have with your health care provider. Emergency Department Resource Guide 1) Find a Doctor and Pay Out of Pocket Although you won't have to find out who is covered by your insurance plan, it is a good idea to ask around and get recommendations. You will then need to call the office and see if the doctor you have chosen will accept you as a new patient and what types of options they offer for patients who are self-pay. Some doctors offer discounts or will set up payment plans for their patients who do not have insurance, but you will need to ask so you aren't surprised when you get to your appointment.  2) Contact Your Local Health Department Not all health departments have doctors that can see patients for sick visits, but many do, so it is worth a call to see if yours does. If you don't know where your local health department is, you can check in your phone book. The CDC also has a tool to help you locate your state's health department, and many state websites also have listings of all of their local health departments.  3) Find a Walk-in Clinic If your illness is not likely to be very severe or complicated, you may want to try a walk in clinic. These are popping up all over the country in pharmacies, drugstores, and shopping centers. They're usually staffed by nurse practitioners or physician assistants that have been trained to treat common illnesses and complaints. They're usually fairly quick and inexpensive.  However, if you have serious medical issues or chronic medical problems, these are probably not your best option.  No Primary Care Doctor: - Call Health Connect at  [phone] - they can help you locate a primary care doctor that  accepts your insurance, provides certain services, etc. - Physician Referral Service- [phone]  Chronic Pain Problems: Organization         Address  Phone   Notes  [facility]  [phone] Patients need to be referred by their primary care doctor.   Medication Assistance: Organization         Address  Phone   Notes  [facility] Medication Assistance Program [address] [place], [place] [address] [phone] --Must be a resident of [facility] -- Must have NO insurance coverage whatsoever (no Medicaid/ Medicare, etc.) -- The pt. MUST have a primary care doctor that directs their care regularly and follows them in the community   [facility]  [phone]   [place]  [phone]     Dental Care: Organization         Address  Phone  Notes  [facility] Department of [facility] [address] [place], [place] [phone] Accepts children up to age 28 who are enrolled in [place] or [place] Health Choice; pregnant women with a Medicaid card; and children who have applied for Medicaid or [place] Health Choice, but were declined, whose parents can pay a reduced fee at time of service.  [facility] Department of [facility]  [address], [place] [phone] Accepts children up to age 82 who are enrolled in [place] or [place] Health Choice; pregnant women with a Medicaid card; and children who have applied for Medicaid or [place] Health Choice, but were declined, whose parents can pay a reduced fee at time of service.  [facility]  [address] [place], [place] [phone] Patients are seen by appointment only. Walk-ins are not accepted.  [facility] will see patients 27 years of age and older. Monday - Tuesday (8am-5pm) Most Wednesdays (8:30-5pm) $30 per visit, cash only  [facility]  [address], [facility] [phone] Patients are seen by appointment only. Walk-ins are not accepted. [facility] will see patients 25 years of age and older. One Wednesday Evening (Monthly: Volunteer Based).  $30 per visit, cash only  [employer]  [phone] for adults; Children under age 65, call Graduate Pediatric Dentistry at [phone]. Children aged 64-14, please call [phone] to request a pediatric application.  Dental services are provided in all areas of dental care including fillings, crowns and bridges, complete and partial dentures, implants, gum treatment, root canals, and extractions. Preventive care is also provided. Treatment is provided to both adults and children. Patients are selected via a lottery and there is often a waiting list.   [facility] [address], [place]  [phone] [email or web address]   [facility] [address] [place], [place] [phone], Ext. [phone] Second and Fourth Thursday of each month, opens at 6:30 AM; Clinic ends at 9 AM.  Patients are seen on a first-come first-served basis, and a limited number are seen during each clinic.   [facility]  [address] [place], [place] [phone]   Eligibility Requirements You must have lived in [place], [place], or [place] counties for at least the last three months.   You cannot be eligible for state or federal sponsored [place], including [employer], [place], or [employer].   You generally cannot be eligible for healthcare insurance through your employer.    How to apply: Eligibility screenings are held every Tuesday and Wednesday afternoon from 1:00 pm until 4:00 pm. You do not need an appointment for the  interview!  [facility] [address], [place], [place] [phone]   [facility] Health Department  [phone]   [facility] Health Department  [phone]   [facility] Health Department  [phone]

## 2014-05-04 NOTE — ED Provider Notes (Signed)
Medical screening examination/treatment/procedure(s) were performed by non-physician practitioner and as supervising physician I was immediately available for consultation/collaboration.   EKG Interpretation None        Dayln Tugwell, MD 05/04/14 1121 

## 2014-07-01 ENCOUNTER — Emergency Department (HOSPITAL_COMMUNITY): Payer: Managed Care, Other (non HMO)

## 2014-07-01 ENCOUNTER — Encounter (HOSPITAL_COMMUNITY): Payer: Self-pay | Admitting: Emergency Medicine

## 2014-07-01 ENCOUNTER — Emergency Department (HOSPITAL_COMMUNITY)
Admission: EM | Admit: 2014-07-01 | Discharge: 2014-07-02 | Disposition: A | Discharge status: Home / Self Care | Payer: Managed Care, Other (non HMO) | Attending: Emergency Medicine | Admitting: Emergency Medicine

## 2014-07-01 DIAGNOSIS — R0789 Other chest pain: Secondary | ICD-10-CM | POA: Insufficient documentation

## 2014-07-01 DIAGNOSIS — R0602 Shortness of breath: Secondary | ICD-10-CM | POA: Insufficient documentation

## 2014-07-01 DIAGNOSIS — Z720 Tobacco use: Secondary | ICD-10-CM | POA: Insufficient documentation

## 2014-07-01 LAB — BASIC METABOLIC PANEL
Anion gap: 11 (ref 5–15)
BUN: 14 mg/dL (ref 6–23)
CO2: 26 mEq/L (ref 19–32)
Calcium: 9.9 mg/dL (ref 8.4–10.5)
Chloride: 103 mEq/L (ref 96–112)
Creatinine, Ser: 1.29 mg/dL (ref 0.50–1.35)
GFR calc Af Amer: 85 mL/min — ABNORMAL LOW (ref >90)
GFR calc non Af Amer: 74 mL/min — ABNORMAL LOW (ref >90)
Glucose, Bld: 68 mg/dL — ABNORMAL LOW (ref 70–99)
Potassium: 3.8 mEq/L (ref 3.7–5.3)
Sodium: 140 mEq/L (ref 137–147)

## 2014-07-01 LAB — CBC
HCT: 39.4 % (ref 39.0–52.0)
Hemoglobin: 14 g/dL (ref 13.0–17.0)
MCH: 28.9 pg (ref 26.0–34.0)
MCHC: 35.5 g/dL (ref 30.0–36.0)
MCV: 81.4 fL (ref 78.0–100.0)
Platelets: 207 10*3/uL (ref 150–400)
RBC: 4.84 MIL/uL (ref 4.22–5.81)
RDW: 12.3 % (ref 11.5–15.5)
WBC: 4.4 10*3/uL (ref 4.0–10.5)

## 2014-07-01 LAB — RAPID URINE DRUG SCREEN, HOSP PERFORMED
AMPHETAMINES: NOT DETECTED
Barbiturates: NOT DETECTED
Benzodiazepines: NOT DETECTED
Cocaine: NOT DETECTED
OPIATES: NOT DETECTED
Tetrahydrocannabinol: NOT DETECTED

## 2014-07-01 LAB — I-STAT TROPONIN, ED: Troponin i, poc: 0 ng/mL (ref 0.00–0.08)

## 2014-07-01 LAB — D-DIMER, QUANTITATIVE (NOT AT ARMC)

## 2014-07-01 MED ORDER — LORAZEPAM 2 MG/ML IJ SOLN: 1.0000 mg | Freq: Once | INTRAMUSCULAR | Status: DC

## 2014-07-01 MED ORDER — LORAZEPAM 1 MG PO TABS
1.0000 mg | ORAL_TABLET | Freq: Once | ORAL | Status: AC
  Administered 2014-07-01: 1 mg via ORAL
  Filled 2014-07-01: qty 1

## 2014-07-01 MED ORDER — LORAZEPAM 1 MG PO TABS: 1.0000 mg | ORAL_TABLET | Freq: Four times a day (QID) | ORAL | Status: DC | PRN

## 2014-07-01 NOTE — ED Notes (Signed)
Hannah, PA at bedside  

## 2014-07-01 NOTE — ED Notes (Signed)
Pt presents with c/o difficulty catching his breath onset yesterday suddenly @ 1700, [facility] dx with URI. Pt states worse today. Pt holding small child in chair and repeatedly falling asleep during interview.

## 2014-07-01 NOTE — ED Provider Notes (Signed)
Medical screening examination/treatment/procedure(s) were performed by non-physician practitioner and as supervising physician I was immediately available for consultation/collaboration.   EKG Interpretation None     EKG:  Rhythm: sinus bradycardia Rate: 53 PR: 164 ms QRS: 109 ms QTc: 373 ms ST segments: normal   Raeford RazorStephen Eniola Cerullo, MD 07/01/14 2357

## 2014-07-01 NOTE — Discharge Instructions (Signed)
Shortness of Breath °Shortness of breath means you have trouble breathing. Shortness of breath needs medical care right away. °HOME CARE  °· Do not smoke. °· Avoid being around chemicals or things (paint fumes, dust) that may bother your breathing. °· Rest as needed. Slowly begin your normal activities. °· Only take medicines as told by your doctor. °· Keep all doctor visits as told. °GET HELP RIGHT AWAY IF:  °· Your shortness of breath gets worse. °· You feel lightheaded, pass out (faint), or have a cough that is not helped by medicine. °· You cough up blood. °· You have pain with breathing. °· You have pain in your chest, arms, shoulders, or belly (abdomen). °· You have a fever. °· You cannot walk up stairs or exercise the way you normally do. °· You do not get better in the time expected. °· You have a hard time doing normal activities even with rest. °· You have problems with your medicines. °· You have any new symptoms. °MAKE SURE YOU: °· Understand these instructions. °· Will watch your condition. °· Will get help right away if you are not doing well or get worse. °Document Released: 01/08/2008 Document Revised: 07/27/2013 Document Reviewed: 10/07/2011 °ExitCare® Patient Information ©2015 [employer]. This information is not intended to replace advice given to you by your health care provider. Make sure you discuss any questions you have with your health care provider. ° °

## 2014-07-01 NOTE — ED Notes (Signed)
Patient transported to X-ray 

## 2014-07-01 NOTE — ED Provider Notes (Signed)
CSN: [id number]     Arrival date & time 07/01/14  2044 History   First MD Initiated Contact with Patient 07/01/14 2158     Chief Complaint  Patient presents with  . Shortness of Breath     (Consider location/radiation/quality/duration/timing/severity/associated sxs/prior Treatment) HPI Comments: Patient is an otherwise healthy 28 year old male who presents the emergency department today for evaluation of shortness of breath. He reports that the shortness of breath began suddenly around 5 PM yesterday. The symptoms began when he was in an argument. He reports feeling very frustrated. He was able to sleep through the night, but reports he only slept for a few hours. His shortness of breath has persisted throughout today. The pain is not pleuritic in nature. He feels as though he cannot catch his breath. He has some associated burning chest pain. He was evaluated at [facility] yesterday and was told he has a virus. He denies any history of heart disease. No family history of early heart disease. He denies any drug use. No long trips, recent surgeries, history of DVT or PE, or history of cancer. He does sometimes smoke cigarettes.  The history is provided by the patient. No language interpreter was used.    History reviewed. No pertinent past medical history. No past surgical history on file. No family history on file. History  Substance Use Topics  . Smoking status: Current Some Day Smoker    Types: Cigars  . Smokeless tobacco: Never Used  . Alcohol Use: Yes     Comment: Socially    Review of Systems  Constitutional: Negative for fever and chills.  HENT: Negative for congestion, rhinorrhea and sore throat.   Respiratory: Positive for shortness of breath. Negative for cough and wheezing.   Cardiovascular: Positive for chest pain. Negative for leg swelling.  Gastrointestinal: Negative for nausea, vomiting and abdominal pain.  All other systems reviewed and are  negative.     Allergies  Review of patient's allergies indicates no known allergies.  Home Medications   Prior to Admission medications   Medication Sig Start Date End Date Taking? Authorizing Provider  doxycycline (VIBRAMYCIN) 100 MG capsule Take 1 capsule (100 mg total) by mouth 2 (two) times daily. One po bid x 7 days Patient not taking: Reported on 07/01/2014 05/02/14   Patty Sermons Camprubi-Soms, PA-C  fluticasone (FLONASE) 50 MCG/ACT nasal spray Place 2 sprays into both nostrils daily. Patient not taking: Reported on 07/01/2014 05/02/14   Patty Sermons Camprubi-Soms, PA-C  HYDROcodone-acetaminophen (NORCO) 5-325 MG per tablet Take 1-2 tablets by mouth every 6 (six) hours as needed for severe pain. Patient not taking: Reported on 07/01/2014 05/02/14   Patty Sermons Camprubi-Soms, PA-C  naproxen (NAPROSYN) 500 MG tablet Take 1 tablet (500 mg total) by mouth 2 (two) times daily as needed for mild pain, moderate pain or headache (TAKE WITH MEALS.). Patient not taking: Reported on 07/01/2014 05/02/14   Patty Sermons Camprubi-Soms, PA-C  Sodium Chloride-Sodium Bicarb (NETI POT SINUS [place]) 2300-700 MG KIT Place 1 application into the nose 3 (three) times daily as needed (sinus congestion). Patient not taking: Reported on 07/01/2014 05/02/14   Patty Sermons Camprubi-Soms, PA-C   BP 140/94 mmHg  Pulse 65  Temp(Src) 97.5 F (36.4 C) (Oral)  Resp 20  Ht _0  (1.88 m)  Wt 210 lb (95.255 kg)  BMI 26.95 kg/m2  SpO2 100% Physical Exam  Constitutional: He is oriented to person, place, and time. He appears well-developed and well-nourished. No distress.  HENT:  Head: Normocephalic and atraumatic.  Right Ear: External ear normal.  Left Ear: External ear normal.  Nose: Nose normal.  Eyes: Conjunctivae are normal.  Neck: Normal range of motion. No tracheal deviation present.  Cardiovascular: Normal rate, regular rhythm and normal heart sounds.   Pulmonary/Chest: Effort normal and  breath sounds normal. No stridor. Tachypnea noted. He has no decreased breath sounds. He has no wheezes. He has no rhonchi. He has no rales.  Speaking in full sentences  Abdominal: Soft. He exhibits no distension. There is no tenderness.  Musculoskeletal: Normal range of motion.  Neurological: He is alert and oriented to person, place, and time.  Skin: Skin is warm and dry. He is not diaphoretic.  Psychiatric: He has a normal mood and affect. His behavior is normal.  Nursing note and vitals reviewed.   ED Course  Procedures (including critical care time) Labs Review Labs Reviewed  BASIC METABOLIC PANEL - Abnormal; Notable for the following:    Glucose, Bld 68 (*)    GFR calc non Af Amer 74 (*)    GFR calc Af Amer 85 (*)    All other components within normal limits  CBC  D-DIMER, QUANTITATIVE  URINE RAPID DRUG SCREEN (HOSP PERFORMED)  I-STAT TROPOININ, ED  Randolm Idol, ED    Imaging Review Dg Chest 2 View  07/01/2014   CLINICAL DATA:  Acute onset of shortness of breath and mid chest burning. History of smoking. Initial encounter.  EXAM: CHEST  2 VIEW  COMPARISON:  Chest radiograph performed 06/30/2014  FINDINGS: The lungs are well-aerated and clear. There is no evidence of focal opacification, pleural effusion or pneumothorax.  The heart is normal in size; the mediastinal contour is within normal limits. No acute osseous abnormalities are seen.  IMPRESSION: No acute cardiopulmonary process seen.   Electronically Signed   By: Garald Balding M.D.   On: 07/01/2014 22:52     EKG Interpretation None      MDM   Final diagnoses:  Shortness of breath    Patient presents to emergency department for evaluation of shortness of breath. Workup is generally unremarkable. No concern for pulmonary embolism. No pneumonia seen on x-ray. Patient feels improved after Ativan. Encouraged patient to follow up with PCP. Discussed reasons to return to ED immediately. Vital signs stable for  discharge. Discussed case with Dr. Wilson Singer who agrees with plan. Patient / Family / Caregiver informed of clinical course, understand medical decision-making process, and agree with plan.     Elwyn Lade, PA-C 07/02/14 [id number]  Virgel Manifold, MD 07/02/14 [phone]

## 2014-07-25 ENCOUNTER — Emergency Department (HOSPITAL_COMMUNITY): Payer: Managed Care, Other (non HMO)

## 2014-07-25 ENCOUNTER — Encounter (HOSPITAL_COMMUNITY): Payer: Self-pay | Admitting: *Deleted

## 2014-07-25 ENCOUNTER — Emergency Department (HOSPITAL_COMMUNITY)
Admission: EM | Admit: 2014-07-25 | Discharge: 2014-07-25 | Disposition: A | Discharge status: Home / Self Care | Payer: Managed Care, Other (non HMO) | Attending: Emergency Medicine | Admitting: Emergency Medicine

## 2014-07-25 DIAGNOSIS — R0602 Shortness of breath: Secondary | ICD-10-CM | POA: Insufficient documentation

## 2014-07-25 DIAGNOSIS — Z720 Tobacco use: Secondary | ICD-10-CM | POA: Insufficient documentation

## 2014-07-25 DIAGNOSIS — Z7951 Long term (current) use of inhaled steroids: Secondary | ICD-10-CM | POA: Insufficient documentation

## 2014-07-25 DIAGNOSIS — R079 Chest pain, unspecified: Secondary | ICD-10-CM | POA: Insufficient documentation

## 2014-07-25 MED ORDER — LORAZEPAM 1 MG PO TABS
1.0000 mg | ORAL_TABLET | Freq: Once | ORAL | Status: AC
  Administered 2014-07-25: 1 mg via ORAL
  Filled 2014-07-25: qty 1

## 2014-07-25 NOTE — ED Notes (Signed)
The pt takes anxiety pills but he was at work and he did noit have his meds

## 2014-07-25 NOTE — ED Provider Notes (Signed)
CSN: [id number]     Arrival date & time 07/25/14  1951 History   First MD Initiated Contact with Patient 07/25/14 2047     Chief Complaint  Patient presents with  . Anxiety     (Consider location/radiation/quality/duration/timing/severity/associated sxs/prior Treatment) Patient is a 28 y.o. male presenting with anxiety. The history is provided by the patient and medical records.  Anxiety Associated symptoms include chest pain.   This is a 28 year old male with past medical history significant for anxiety, presenting to the ED for an episode of shortness breath and chest discomfort while working this afternoon. Patient states symptoms were very brief and have resolved at this time. States he is fairly confident that this was another flare of his anxiety. States he does have anxiety medication, however his medication is in [facility] and he does not feel that he could make it there. Patient denies prior cardiac history. He has recently started smoking again.  No recent travel, LE edema, calf pain, or surgeries. No prior hx of DVT or PE.  VS stable on arrival.  History reviewed. No pertinent past medical history. History reviewed. No pertinent past surgical history. No family history on file. History  Substance Use Topics  . Smoking status: Current Some Day Smoker    Types: Cigars  . Smokeless tobacco: Never Used  . Alcohol Use: Yes     Comment: Socially    Review of Systems  Respiratory: Positive for shortness of breath.   Cardiovascular: Positive for chest pain.  All other systems reviewed and are negative.     Allergies  Review of patient's allergies indicates no known allergies.  Home Medications   Prior to Admission medications   Medication Sig Start Date End Date Taking? Authorizing Provider  doxycycline (VIBRAMYCIN) 100 MG capsule Take 1 capsule (100 mg total) by mouth 2 (two) times daily. One po bid x 7 days Patient not taking: Reported on 07/01/2014 05/02/14    Patty Sermons Camprubi-Soms, PA-C  fluticasone (FLONASE) 50 MCG/ACT nasal spray Place 2 sprays into both nostrils daily. Patient not taking: Reported on 07/01/2014 05/02/14   Patty Sermons Camprubi-Soms, PA-C  HYDROcodone-acetaminophen (NORCO) 5-325 MG per tablet Take 1-2 tablets by mouth every 6 (six) hours as needed for severe pain. Patient not taking: Reported on 07/01/2014 05/02/14   Patty Sermons Camprubi-Soms, PA-C  LORazepam (ATIVAN) 1 MG tablet Take 1 tablet (1 mg total) by mouth every 6 (six) hours as needed for anxiety. 07/01/14   Elwyn Lade, PA-C  naproxen (NAPROSYN) 500 MG tablet Take 1 tablet (500 mg total) by mouth 2 (two) times daily as needed for mild pain, moderate pain or headache (TAKE WITH MEALS.). Patient not taking: Reported on 07/01/2014 05/02/14   Patty Sermons Camprubi-Soms, PA-C  Sodium Chloride-Sodium Bicarb (NETI POT SINUS [place]) 2300-700 MG KIT Place 1 application into the nose 3 (three) times daily as needed (sinus congestion). Patient not taking: Reported on 07/01/2014 05/02/14   Patty Sermons Camprubi-Soms, PA-C   BP 119/69 mmHg  Pulse 59  Temp(Src) 97.4 F (36.3 C)  Resp 18  SpO2 100%   Physical Exam  Constitutional: He is oriented to person, place, and time. He appears well-developed and well-nourished. No distress.  Lying comfortably in bed, no acute distress  HENT:  Head: Normocephalic and atraumatic.  Mouth/Throat: Oropharynx is clear and moist.  Eyes: Conjunctivae and EOM are normal. Pupils are equal, round, and reactive to light.  Neck: Normal range of motion. Neck supple.  Cardiovascular: Normal rate,  regular rhythm and normal heart sounds.   Pulmonary/Chest: Effort normal and breath sounds normal. No respiratory distress. He has no wheezes.  Respirations unlabored, lungs clear bilaterally, speaking in full complete sentences without difficulty  Abdominal: Soft. Bowel sounds are normal. There is no tenderness. There is no guarding.   Musculoskeletal: Normal range of motion.  No calf asymmetry, tenderness, or palpable cords  Neurological: He is alert and oriented to person, place, and time.  Skin: Skin is warm and dry. He is not diaphoretic.  Psychiatric: He has a normal mood and affect.  Does not appear overly anxious  Nursing note and vitals reviewed.   ED Course  Procedures (including critical care time) Labs Review Labs Reviewed - No data to display  Imaging Review Dg Chest 2 View  07/25/2014   CLINICAL DATA:  Initial evaluation for chest pain.  EXAM: CHEST  2 VIEW  COMPARISON:  Prior study from 07/01/2014  FINDINGS: The cardiac and mediastinal silhouettes are stable in size and contour, and remain within normal limits.  The lungs are normally inflated. No airspace consolidation, pleural effusion, or pulmonary edema is identified. There is no pneumothorax.  No acute osseous abnormality identified.  IMPRESSION: No active cardiopulmonary disease.   Electronically Signed   By: Jeannine Boga M.D.   On: 07/25/2014 22:37     EKG Interpretation None      MDM   Final diagnoses:  Chest pain, unspecified chest pain type  Shortness of breath   28 year old male with sudden onset of chest pain and shortness of breath which was very short-lived. He does have history of anxiety with similar symptoms in the past. EKG sinus rhythm without ischemic changes. Chest x-ray is clear. Symptoms improved greatly after dose of Ativan in the ED.  Low suspicion for ACS, PE, dissection, or other acute cardiac event. Patient is PERC negative.  Patient discharged home, instructed to continue his PRN anxiety meds.  Discussed plan with patient, he/she acknowledged understanding and agreed with plan of care.  Return precautions given for new or worsening symptoms.  Larene Pickett, PA-C 07/25/14 Darci Needle  Tanna Furry, MD 07/26/14 [phone]

## 2014-07-25 NOTE — ED Notes (Signed)
PA Sharilyn SitesLisa Sanders at bedside.

## 2014-07-25 NOTE — Discharge Instructions (Signed)
continue your home anxiety medications.  Recommend that you stop smoking! Follow-up with your primary care physician. Return to the ED for new or worsening symptoms.

## 2014-07-25 NOTE — ED Notes (Signed)
The pt has panic attacks and today after smoking a cigar he developed some soB AND CHEST DISCOMFORT.  He has had it previous times in the past dx anxiety

## 2014-08-29 ENCOUNTER — Encounter (HOSPITAL_COMMUNITY): Payer: Self-pay | Admitting: *Deleted

## 2014-08-29 ENCOUNTER — Emergency Department (HOSPITAL_COMMUNITY): Payer: Managed Care, Other (non HMO)

## 2014-08-29 ENCOUNTER — Emergency Department (HOSPITAL_COMMUNITY)
Admission: EM | Admit: 2014-08-29 | Discharge: 2014-08-29 | Disposition: A | Discharge status: Home / Self Care | Payer: Managed Care, Other (non HMO) | Attending: Emergency Medicine | Admitting: Emergency Medicine

## 2014-08-29 DIAGNOSIS — R0602 Shortness of breath: Secondary | ICD-10-CM

## 2014-08-29 DIAGNOSIS — E162 Hypoglycemia, unspecified: Secondary | ICD-10-CM | POA: Insufficient documentation

## 2014-08-29 DIAGNOSIS — F419 Anxiety disorder, unspecified: Secondary | ICD-10-CM | POA: Insufficient documentation

## 2014-08-29 DIAGNOSIS — Z720 Tobacco use: Secondary | ICD-10-CM | POA: Insufficient documentation

## 2014-08-29 DIAGNOSIS — R001 Bradycardia, unspecified: Secondary | ICD-10-CM | POA: Insufficient documentation

## 2014-08-29 DIAGNOSIS — R002 Palpitations: Secondary | ICD-10-CM | POA: Insufficient documentation

## 2014-08-29 HISTORY — DX: Anxiety disorder, unspecified: F41.9

## 2014-08-29 LAB — BASIC METABOLIC PANEL
ANION GAP: 8 (ref 5–15)
BUN: 9 mg/dL (ref 6–23)
CO2: 27 mmol/L (ref 19–32)
Calcium: 9.6 mg/dL (ref 8.4–10.5)
Chloride: 103 mmol/L (ref 96–112)
Creatinine, Ser: 1.14 mg/dL (ref 0.50–1.35)
GFR calc Af Amer: >90 mL/min (ref >90)
GFR calc non Af Amer: 85 mL/min — ABNORMAL LOW (ref >90)
Glucose, Bld: 54 mg/dL — ABNORMAL LOW (ref 70–99)
POTASSIUM: 4 mmol/L (ref 3.5–5.1)
Sodium: 138 mmol/L (ref 135–145)

## 2014-08-29 LAB — CBC
HEMATOCRIT: 40.5 % (ref 39.0–52.0)
Hemoglobin: 13.4 g/dL (ref 13.0–17.0)
MCH: 27.1 pg (ref 26.0–34.0)
MCHC: 33.1 g/dL (ref 30.0–36.0)
MCV: 82 fL (ref 78.0–100.0)
PLATELETS: 210 10*3/uL (ref 150–400)
RBC: 4.94 MIL/uL (ref 4.22–5.81)
RDW: 12.6 % (ref 11.5–15.5)
WBC: 3.3 10*3/uL — AB (ref 4.0–10.5)

## 2014-08-29 LAB — I-STAT TROPONIN, ED: Troponin i, poc: 0 ng/mL (ref 0.00–0.08)

## 2014-08-29 LAB — CBG MONITORING, ED: Glucose-Capillary: 94 mg/dL (ref 70–99)

## 2014-08-29 NOTE — ED Provider Notes (Signed)
CSN: [id number]     Arrival date & time 08/29/14  1458 History   First MD Initiated Contact with Patient 08/29/14 1934     Chief Complaint  Patient presents with  . Shortness of Breath  . Anxiety   (Consider location/radiation/quality/duration/timing/severity/associated sxs/prior Treatment) HPI  Miller Limehouse is a -29 year-old male presenting to the emergency department with report of episode of shortness of breath and palpitations today. He reports he's had this feeling intermittently for several days. He notes he has been working out more often and following a careful diet. He is also been taking nutritional supplement several times a day but he states can sometimes make him feel jittery. He currently denies any pain or discomfort. He denies any fevers, chills, chest pain, cough, nausea, vomiting or abdominal pain.  Past Medical History  Diagnosis Date  . Anxiety    History reviewed. No pertinent past surgical history. History reviewed. No pertinent family history. History  Substance Use Topics  . Smoking status: Current Some Day Smoker    Types: Cigars  . Smokeless tobacco: Never Used  . Alcohol Use: Yes     Comment: Socially    Review of Systems  Constitutional: Negative for fever and chills.  HENT: Negative for sore throat.   Eyes: Negative for visual disturbance.  Respiratory: Positive for shortness of breath. Negative for cough.   Cardiovascular: Positive for palpitations. Negative for chest pain and leg swelling.  Gastrointestinal: Negative for nausea, vomiting and diarrhea.  Genitourinary: Negative for dysuria.  Musculoskeletal: Negative for myalgias.  Skin: Negative for rash.  Neurological: Negative for weakness, numbness and headaches.  Psychiatric/Behavioral: The patient is nervous/anxious.     Allergies  Review of patient's allergies indicates no known allergies.  Home Medications   Prior to Admission medications   Medication Sig Start Date End Date Taking?  Authorizing Provider  hydrOXYzine (VISTARIL) 25 MG capsule Take 25 mg by mouth 4 (four) times daily as needed for anxiety.   Yes Historical Provider, MD  fluticasone (FLONASE) 50 MCG/ACT nasal spray Place 2 sprays into both nostrils daily. Patient not taking: Reported on 07/01/2014 05/02/14   Patty Sermons Camprubi-Soms, PA-C  HYDROcodone-acetaminophen (NORCO) 5-325 MG per tablet Take 1-2 tablets by mouth every 6 (six) hours as needed for severe pain. Patient not taking: Reported on 07/01/2014 05/02/14   Patty Sermons Camprubi-Soms, PA-C  LORazepam (ATIVAN) 1 MG tablet Take 1 tablet (1 mg total) by mouth every 6 (six) hours as needed for anxiety. Patient not taking: Reported on 08/29/2014 07/01/14   Elwyn Lade, PA-C  naproxen (NAPROSYN) 500 MG tablet Take 1 tablet (500 mg total) by mouth 2 (two) times daily as needed for mild pain, moderate pain or headache (TAKE WITH MEALS.). Patient not taking: Reported on 07/01/2014 05/02/14   Patty Sermons Camprubi-Soms, PA-C  Sodium Chloride-Sodium Bicarb (NETI POT SINUS [place]) 2300-700 MG KIT Place 1 application into the nose 3 (three) times daily as needed (sinus congestion). Patient not taking: Reported on 07/01/2014 05/02/14   Patty Sermons Camprubi-Soms, PA-C   BP 123/82 mmHg  Pulse 48  Temp(Src) 98.5 F (36.9 C) (Oral)  Resp 15  SpO2 100% Physical Exam  Constitutional: He is oriented to person, place, and time. He appears well-developed and well-nourished. No distress.  HENT:  Head: Normocephalic and atraumatic.  Mouth/Throat: Oropharynx is clear and moist. No oropharyngeal exudate.  Eyes: Conjunctivae are normal. Pupils are equal, round, and reactive to light.  Neck: Neck supple. No thyromegaly present.  Cardiovascular:  Regular rhythm and intact distal pulses.  Bradycardia present.  Exam reveals no gallop and no friction rub.   No murmur heard. Slow, regular rate counted at 56 on auscultation.    Pulmonary/Chest: Effort normal and  breath sounds normal. No respiratory distress. He has no wheezes. He has no rhonchi. He has no rales. He exhibits no tenderness.  Abdominal: Soft. There is no tenderness.  Musculoskeletal: He exhibits no tenderness.  Lymphadenopathy:    He has no cervical adenopathy.  Neurological: He is alert and oriented to person, place, and time.  Skin: Skin is warm and dry. No rash noted. He is not diaphoretic.  Psychiatric: He has a normal mood and affect.  Nursing note and vitals reviewed.   ED Course  Procedures (including critical care time) Labs Review Labs Reviewed  CBC - Abnormal; Notable for the following:    WBC 3.3 (*)    All other components within normal limits  BASIC METABOLIC PANEL - Abnormal; Notable for the following:    Glucose, Bld 54 (*)    GFR calc non Af Amer 85 (*)    All other components within normal limits  I-STAT TROPOININ, ED    Imaging Review Dg Chest 2 View  08/29/2014   CLINICAL DATA:  Shortness of breath, sensation of heart racing and tingling finding ; history of anxiety without relief from anti-anxiety medication  EXAM: CHEST  2 VIEW  COMPARISON:  PA and lateral chest x-ray of July 25, 2014  FINDINGS: The lungs are adequately inflated and clear. The heart and mediastinal structures are normal. The pulmonary vascularity is not engorged. There is no pleural effusion or pneumothorax. The bony thorax is unremarkable.  IMPRESSION: There is no active cardiopulmonary disease.   Electronically Signed   By: David  Martinique   On: 08/29/2014 16:03     EKG Interpretation None      MDM   Final diagnoses:  SOB (shortness of breath)   29 yo male presenting with sensation of shortness of breath but on investigation of his symptoms, he describes feeling more jittery, and anxious.  He reports he has been trying to get healthier and has been working out frequently, eating a strict diet with little to no carbohydrates and taking a dietary supplement.  His blood sugar in the  ED was 54.  Pt reports improvement in "funny feeling" after drinking orange juice.  Discussed with pt eating a balanced diet, especially with increased physical activity and avoiding dietary supplements unless approved by physician. Resources provided to establish care with a PCP. Pt is well-appearing, in no acute distress and vital signs reviewed nd not concerning. He appears safe to be discharged.  Return precautions provided. Pt aware of plan and in agreement.      Filed Vitals:   08/29/14 1516 08/29/14 1835 08/29/14 1944 08/29/14 2000  BP: 141/81 138/88 123/72 123/82  Pulse: 72 58 52 48  Temp: 98.1 F (36.7 C) 98.5 F (36.9 C)    TempSrc: Oral Oral    Resp: '16 16 26 15  ' SpO2: 100% 100% 100% 100%   Meds given in ED:  Medications - No data to display  New Prescriptions   No medications on file       Britt Bottom, NP 08/30/14 [place], MD 08/30/14 1545

## 2014-08-29 NOTE — ED Notes (Signed)
Pt reports "feeling funny" for a couple of days, having sob but denies cough. States he feels like heart is racing. Has hx of anxiety but reports no relief with anxiety meds. HR 72 at triage. spo2 100%.

## 2014-08-29 NOTE — Discharge Instructions (Signed)
Please follow the directions provided. Use the resources guide or the referral provided to establish care with a primary care provider for further evaluation. Please make sure you're eating adequate meals, with enough carbohydrates. His best to avoid dietary supplements and less approved by a physician. If you start to feel this feeling again be sure to eat a small snack. Drink plenty of fluids. Don't hesitate to return for any new, worsening, or concerning symptoms.   SEEK MEDICAL CARE IF:  You are having problems keeping your blood glucose in your target range.  You are having frequent episodes of hypoglycemia.  You feel you might be having side effects from your medicines.  You are not sure why your blood glucose is dropping so low.  You notice a change in vision or a new problem with your vision. SEEK IMMEDIATE MEDICAL CARE IF:  Confusion develops.  A change in mental status occurs.  The inability to swallow develops.  Fainting occurs.

## 2014-09-23 ENCOUNTER — Emergency Department (HOSPITAL_COMMUNITY)
Admission: EM | Admit: 2014-09-23 | Discharge: 2014-09-23 | Disposition: A | Discharge status: Home / Self Care | Payer: Managed Care, Other (non HMO) | Attending: Emergency Medicine | Admitting: Emergency Medicine

## 2014-09-23 ENCOUNTER — Encounter (HOSPITAL_COMMUNITY): Payer: Self-pay | Admitting: Emergency Medicine

## 2014-09-23 DIAGNOSIS — Z7951 Long term (current) use of inhaled steroids: Secondary | ICD-10-CM | POA: Insufficient documentation

## 2014-09-23 DIAGNOSIS — R200 Anesthesia of skin: Secondary | ICD-10-CM | POA: Insufficient documentation

## 2014-09-23 DIAGNOSIS — K029 Dental caries, unspecified: Secondary | ICD-10-CM | POA: Insufficient documentation

## 2014-09-23 DIAGNOSIS — F419 Anxiety disorder, unspecified: Secondary | ICD-10-CM | POA: Insufficient documentation

## 2014-09-23 DIAGNOSIS — K2970 Gastritis, unspecified, without bleeding: Secondary | ICD-10-CM | POA: Insufficient documentation

## 2014-09-23 DIAGNOSIS — Z720 Tobacco use: Secondary | ICD-10-CM | POA: Insufficient documentation

## 2014-09-23 DIAGNOSIS — Z791 Long term (current) use of non-steroidal anti-inflammatories (NSAID): Secondary | ICD-10-CM | POA: Insufficient documentation

## 2014-09-23 DIAGNOSIS — K0889 Other specified disorders of teeth and supporting structures: Secondary | ICD-10-CM

## 2014-09-23 DIAGNOSIS — K088 Other specified disorders of teeth and supporting structures: Secondary | ICD-10-CM | POA: Insufficient documentation

## 2014-09-23 LAB — CBC WITH DIFFERENTIAL/PLATELET
Basophils Absolute: 0 10*3/uL (ref 0.0–0.1)
Basophils Relative: 1 % (ref 0–1)
Eosinophils Absolute: 0 10*3/uL (ref 0.0–0.7)
Eosinophils Relative: 1 % (ref 0–5)
HCT: 39 % (ref 39.0–52.0)
Hemoglobin: 13.5 g/dL (ref 13.0–17.0)
LYMPHS ABS: 2 10*3/uL (ref 0.7–4.0)
Lymphocytes Relative: 51 % — ABNORMAL HIGH (ref 12–46)
MCH: 28.5 pg (ref 26.0–34.0)
MCHC: 34.6 g/dL (ref 30.0–36.0)
MCV: 82.3 fL (ref 78.0–100.0)
Monocytes Absolute: 0.3 10*3/uL (ref 0.1–1.0)
Monocytes Relative: 8 % (ref 3–12)
NEUTROS ABS: 1.5 10*3/uL — AB (ref 1.7–7.7)
NEUTROS PCT: 39 % — AB (ref 43–77)
PLATELETS: 204 10*3/uL (ref 150–400)
RBC: 4.74 MIL/uL (ref 4.22–5.81)
RDW: 12.4 % (ref 11.5–15.5)
WBC: 3.9 10*3/uL — AB (ref 4.0–10.5)

## 2014-09-23 LAB — COMPREHENSIVE METABOLIC PANEL
ALT: 18 U/L (ref 0–53)
ANION GAP: 7 (ref 5–15)
AST: 25 U/L (ref 0–37)
Albumin: 4.1 g/dL (ref 3.5–5.2)
Alkaline Phosphatase: 67 U/L (ref 39–117)
BUN: 14 mg/dL (ref 6–23)
CALCIUM: 9.3 mg/dL (ref 8.4–10.5)
CO2: 27 mmol/L (ref 19–32)
Chloride: 105 mmol/L (ref 96–112)
Creatinine, Ser: 1.09 mg/dL (ref 0.50–1.35)
GFR, EST NON AFRICAN AMERICAN: 90 mL/min — AB (ref >90)
GLUCOSE: 97 mg/dL (ref 70–99)
Potassium: 4.1 mmol/L (ref 3.5–5.1)
SODIUM: 139 mmol/L (ref 135–145)
Total Bilirubin: 0.8 mg/dL (ref 0.3–1.2)
Total Protein: 7.3 g/dL (ref 6.0–8.3)

## 2014-09-23 LAB — URINALYSIS, ROUTINE W REFLEX MICROSCOPIC
BILIRUBIN URINE: NEGATIVE
Glucose, UA: NEGATIVE mg/dL
Hgb urine dipstick: NEGATIVE
Ketones, ur: NEGATIVE mg/dL
LEUKOCYTES UA: NEGATIVE
NITRITE: NEGATIVE
PH: 7.5 (ref 5.0–8.0)
Protein, ur: NEGATIVE mg/dL
SPECIFIC GRAVITY, URINE: 1.009 (ref 1.005–1.030)
UROBILINOGEN UA: 0.2 mg/dL (ref 0.0–1.0)

## 2014-09-23 LAB — LIPASE, BLOOD: Lipase: 32 U/L (ref 11–59)

## 2014-09-23 MED ORDER — TRAMADOL HCL 50 MG PO TABS: 50.0000 mg | ORAL_TABLET | Freq: Four times a day (QID) | ORAL | Status: DC | PRN

## 2014-09-23 MED ORDER — AMOXICILLIN 500 MG PO CAPS: 500.0000 mg | ORAL_CAPSULE | Freq: Three times a day (TID) | ORAL | Status: DC

## 2014-09-23 MED ORDER — OMEPRAZOLE 20 MG PO CPDR: 20.0000 mg | DELAYED_RELEASE_CAPSULE | Freq: Every day | ORAL | Status: DC

## 2014-09-23 MED ORDER — SUCRALFATE 1 G PO TABS: 1.0000 g | ORAL_TABLET | Freq: Three times a day (TID) | ORAL | Status: DC

## 2014-09-23 MED ORDER — GI COCKTAIL ~~LOC~~
30.0000 mL | Freq: Once | ORAL | Status: AC
  Administered 2014-09-23: 30 mL via ORAL
  Filled 2014-09-23: qty 30

## 2014-09-23 MED ORDER — KETOROLAC TROMETHAMINE 30 MG/ML IJ SOLN
30.0000 mg | Freq: Once | INTRAMUSCULAR | Status: AC
  Administered 2014-09-23: 30 mg via INTRAVENOUS
  Filled 2014-09-23: qty 1

## 2014-09-23 NOTE — Discharge Instructions (Signed)
Dental Pain A tooth ache may be caused by cavities (tooth decay). Cavities expose the nerve of the tooth to air and hot or cold temperatures. It may come from an infection or abscess (also called a boil or furuncle) around your tooth. It is also often caused by dental caries (tooth decay). This causes the pain you are having. DIAGNOSIS  Your caregiver can diagnose this problem by exam. TREATMENT   If caused by an infection, it may be treated with medications which kill germs (antibiotics) and pain medications as prescribed by your caregiver. Take medications as directed.  Only take over-the-counter or prescription medicines for pain, discomfort, or fever as directed by your caregiver.  Whether the tooth ache today is caused by infection or dental disease, you should see your dentist as soon as possible for further care. SEEK MEDICAL CARE IF: The exam and treatment you received today has been provided on an emergency basis only. This is not a substitute for complete medical or dental care. If your problem worsens or new problems (symptoms) appear, and you are unable to meet with your dentist, call or return to this location. SEEK IMMEDIATE MEDICAL CARE IF:   You have a fever.  You develop redness and swelling of your face, jaw, or neck.  You are unable to open your mouth.  You have severe pain uncontrolled by pain medicine. MAKE SURE YOU:   Understand these instructions.  Will watch your condition.  Will get help right away if you are not doing well or get worse. Document Released: 07/22/2005 Document Revised: 10/14/2011 Document Reviewed: 03/09/2008 [facility] Patient Information 2015 [place], [place]. This information is not intended to replace advice given to you by your health care provider. Make sure you discuss any questions you have with your health care provider.   Gastritis, Adult Gastritis is soreness and swelling (inflammation) of the lining of the stomach. Gastritis can  develop as a sudden onset (acute) or long-term (chronic) condition. If gastritis is not treated, it can lead to stomach bleeding and ulcers. CAUSES  Gastritis occurs when the stomach lining is weak or damaged. Digestive juices from the stomach then inflame the weakened stomach lining. The stomach lining may be weak or damaged due to viral or bacterial infections. One common bacterial infection is the Helicobacter pylori infection. Gastritis can also result from excessive alcohol consumption, taking certain medicines, or having too much acid in the stomach.  SYMPTOMS  In some cases, there are no symptoms. When symptoms are present, they may include:  Pain or a burning sensation in the upper abdomen.  Nausea.  Vomiting.  An uncomfortable feeling of fullness after eating. DIAGNOSIS  Your caregiver may suspect you have gastritis based on your symptoms and a physical exam. To determine the cause of your gastritis, your caregiver may perform the following:  Blood or stool tests to check for the H pylori bacterium.  Gastroscopy. A thin, flexible tube (endoscope) is passed down the esophagus and into the stomach. The endoscope has a light and camera on the end. Your caregiver uses the endoscope to view the inside of the stomach.  Taking a tissue sample (biopsy) from the stomach to examine under a microscope. TREATMENT  Depending on the cause of your gastritis, medicines may be prescribed. If you have a bacterial infection, such as an H pylori infection, antibiotics may be given. If your gastritis is caused by too much acid in the stomach, H2 blockers or antacids may be given. Your caregiver may recommend  that you stop taking aspirin, ibuprofen, or other nonsteroidal anti-inflammatory drugs (NSAIDs). HOME CARE INSTRUCTIONS  Only take over-the-counter or prescription medicines as directed by your caregiver.  If you were given antibiotic medicines, take them as directed. Finish them even if you start  to feel better.  Drink enough fluids to keep your urine clear or pale yellow.  Avoid foods and drinks that make your symptoms worse, such as:  Caffeine or alcoholic drinks.  Chocolate.  Peppermint or mint flavorings.  Garlic and onions.  Spicy foods.  Citrus fruits, such as oranges, lemons, or limes.  Tomato-based foods such as sauce, chili, salsa, and pizza.  Fried and fatty foods.  Eat small, frequent meals instead of large meals. SEEK IMMEDIATE MEDICAL CARE IF:   You have black or dark red stools.  You vomit blood or material that looks like coffee grounds.  You are unable to keep fluids down.  Your abdominal pain gets worse.  You have a fever.  You do not feel better after 1 week.  You have any other questions or concerns. MAKE SURE YOU:  Understand these instructions.  Will watch your condition.  Will get help right away if you are not doing well or get worse. Document Released: 07/16/2001 Document Revised: 01/21/2012 Document Reviewed: 09/04/2011 [facility] Patient Information 2015 [place], [place]. This information is not intended to replace advice given to you by your health care provider. Make sure you discuss any questions you have with your health care provider.   Emergency Department Resource Guide 1) Find a Doctor and Pay Out of Pocket Although you won't have to find out who is covered by your insurance plan, it is a good idea to ask around and get recommendations. You will then need to call the office and see if the doctor you have chosen will accept you as a new patient and what types of options they offer for patients who are self-pay. Some doctors offer discounts or will set up payment plans for their patients who do not have insurance, but you will need to ask so you aren't surprised when you get to your appointment.  2) Contact Your Local Health Department Not all health departments have doctors that can see patients for sick visits, but many do,  so it is worth a call to see if yours does. If you don't know where your local health department is, you can check in your phone book. The CDC also has a tool to help you locate your state's health department, and many state websites also have listings of all of their local health departments.  3) Find a Walk-in Clinic If your illness is not likely to be very severe or complicated, you may want to try a walk in clinic. These are popping up all over the country in pharmacies, drugstores, and shopping centers. They're usually staffed by nurse practitioners or physician assistants that have been trained to treat common illnesses and complaints. They're usually fairly quick and inexpensive. However, if you have serious medical issues or chronic medical problems, these are probably not your best option.  No Primary Care Doctor: - Call Health Connect at  [phone] - they can help you locate a primary care doctor that  accepts your insurance, provides certain services, etc. - Physician Referral Service- [phone]  Chronic Pain Problems: Organization         Address  Phone   Notes  [facility]  [phone] Patients need to be referred by their  primary care doctor.   Medication Assistance: Organization         Address  Phone   Notes  [facility] Medication [facility] [address] [place] [place], [place] [address] [phone] --Must be a resident of [facility] -- Must have NO insurance coverage whatsoever (no Medicaid/ Medicare, etc.) -- The pt. MUST have a primary care doctor that directs their care regularly and follows them in the community   [facility]ist  [phone]   [employer]  [phone]    Agencies that provide inexpensive medical care: Organization         Address  Phone   Notes  [facility]  [phone]   [facility]    [phone]   [facility] [address] [place], [place] [address] [phone]   [facility] [address] [place] [place] [phone]   Planned Parenthood    [phone]   [facility]    [phone]   [facility]  [address], [place] Phone:  [phone], Fax:  [phone] Hours of Operation:  9 am - 6 pm, M-F.  Also accepts Medicaid/Medicare and self-pay.  [facility]  [address], [place] Phone: [phone], Fax: [phone]. Hours of Operation:  8:30 am - 5:30 pm, M-F.  Also accepts Medicaid and self-pay.  [facility] [place] [address], [place] Phone: [phone]   [facility] [address] Natasha Bence [place], [place] [phone], Ext. [phone] Mondays & Thursdays: 7-9 AM.  First 15 patients are seen on a first come, first serve basis.    Medicaid-accepting [facility] Providers:  Organization         Address  Phone   Notes  [facility] [address],  [phone] Also accepts self-pay patients.  [facility] [address] Laurell Josephs [place], [place]  [phone]   [facility] [address], [place] [phone]   [facility] [address], [place] [phone]   Renaye Rakers [address], [place]   [phone] Only accepts [place] patients after they have their name applied to their card.   Self-Pay (no insurance) in [facility]:  Organization         Address  Phone   Notes  Sickle Cell Patients, [facility] [address] [place], [place] [phone]   [facility] [address] [place], [place] [phone]   [facility]  [address], [place] [phone]   [facility]/Dr. Osei-Bonsu  [address], [place] or  [address], [place] [phone] Phone number for both [place] and [place] locations is the same.  [facility] [address] [place] [phone]   [facility] [address] [place] or [address] [phone]   [facility] [address] [place] [phone], phone; [phone], fax Sees patients 1st and 3rd Saturday of every month.  Must not qualify for public or private insurance (i.e. Medicaid, Medicare, [place] Health Choice, Veterans' Benefits)  Household income should be no more than 200%  of the poverty level The clinic cannot treat you if you are pregnant or think you are pregnant  Sexually transmitted diseases are not treated at the clinic.    Dental Care: Organization         Address  Phone  Notes  [facility] Department of [facility] [address] [place], [place] [phone] Accepts children up to age 76 who are enrolled in [place] or [place] Health Choice; pregnant women with a Medicaid card; and children who have applied for Medicaid or [place] Health Choice, but were declined, whose parents can pay a reduced fee at time of service.  [facility] Department of [facility]  [address], [place] [phone] Accepts children up to age 9 who are enrolled in [place] or [place]  Health Choice; pregnant women with a Medicaid card; and children who have applied for Medicaid or [place] Health Choice, but were declined, whose parents can pay a reduced fee at time of service.  [facility]  [address] [place], [place] [phone] Patients are seen by appointment only. Walk-ins are not accepted. [facility] will see patients 86 years of age and older. Monday - Tuesday (8am-5pm) Most Wednesdays (8:30-5pm) $30 per visit, cash only  [facility]  [address], [facility] [phone] Patients are seen by appointment only. Walk-ins are not accepted. [facility] will see patients 41 years of age and older. One Wednesday Evening (Monthly: Volunteer Based).  $30 per visit, cash only  [employer]  [phone] for adults; Children under age 62, call Graduate Pediatric Dentistry at [phone]. Children aged 52-14, please call [phone] to request a pediatric application.  Dental services are provided in all areas of dental care including fillings, crowns and bridges, complete and partial dentures, implants, gum treatment, root canals, and extractions. Preventive care is also provided. Treatment is provided to both adults and children. Patients are selected via a lottery and there is often a waiting list.   [facility] [address], [place]  [phone] [email or web address]   [facility] [address] [place], [place] [phone], Ext. [phone] Second and Fourth Thursday of each month, opens at 6:30 AM; Clinic ends at 9 AM.  Patients are seen on a first-come first-served basis, and a limited number are seen during each clinic.   [facility]  [address] Ether Griffins [place], [place] [phone]   Eligibility Requirements You must have lived in [place], [place], or [place] counties for at least the last three months.   You cannot be eligible for state or federal sponsored [place], including [employer], [place], or [employer].   You generally cannot be eligible for healthcare insurance through your employer.    How to apply: Eligibility screenings are held every Tuesday and Wednesday afternoon from 1:00 pm until 4:00 pm. You do not need an appointment for the interview!  [facility] [address] [place], [place] [phone]   [facility] Health Department  [phone]   [facility] Health Department  [phone]   [facility]  Health Department  [phone]    Behavioral Health Resources in the Community: Intensive Outpatient Programs Organization         Address  Phone  Notes  [facility] [address] [place], [place] [phone]   [facility] Outpatient  [address], [place], [place] [phone]   [facility] [address], [place], [place]  [phone]   [facility] [address],  [place], [place] [phone] or [phone]   Substance Abuse Resources Organization         Address  Phone  Notes  [facility]  [phone]   [facility]  [phone]   [facility]  [phone]   [facility]  [phone]   Residential & Outpatient Substance Abuse Program  [phone]   Psychological Services Organization         Address  Phone  Notes  [facility]  [phone]- [phone]   [facility]  [phone]- [phone]   [facility] [address], [place] [phone] or [phone]    Mobile Crisis Teams Organization         Address  Phone  Notes  [facility], Mobile Crisis Care Unit  [phone]   [facility]  [address] [place], [place] [phone]   Doristine LocksSharon DeEsch [address] [place] [phone]    Self-Help/Support Groups Organization         Address  Phone             Notes  [facility] [place] - variety of support groups  [phone]- [phone] Call for more information  Narcotics Anonymous (NA), [facility] [address], [place]  2 meetings at this location   Statisticianesidential Treatment Programs Organization         Address  Phone  Notes  [facility] [address],    [place]  [phone]   [facility]  [address], [place] [phone], [place], [place] [phone]   [facility] [address], [place] [phone]  Admissions: 8am-3pm M-F  [facility] [address]    [place], [place] [phone]   [facility] [address], [place], [place] [phone]   [facility] [address]  [place], [place] [phone]   [facility] - Intensive Outpatient [address], [place], [place] [phone]   [facility] ([facility] [address]  [place], [place] [phone] or [phone]   [facility] ([facility]) [address] [place], [place] [phone] Accepts Medicaid  [facility] [address]  [place] [phone] Substance Abuse/Addiction Treatment   [facility] Resources Organization         Address  Phone  Notes  [facility]  [phone]   Angie FavaJulie Brannon, PhD [address], [address] [place], [place]   [phone] or [phone]) [phone]   [facility]   [address] [place], [place] [phone]   [facility] [address], [place], [place] [phone]) [phone] Insurance/Medicaid/sponsorship through [facility]  [facility] Families [address]                                    [place], [place] [phone]) [phone] Therapy/tele-psych/case  [facility] [address].   [place], [place] [phone]    Dr. Lolly MustacheArfeen  [phone]) [phone]   [facility]  [employer] [facility] 1) [address], [address] [place] 2) [address], [place] 3)  [address],  [place] [phone]  [phone]   [place] [phone] or [phone] (After Hours)

## 2014-09-23 NOTE — ED Provider Notes (Signed)
CSN: [id number]     Arrival date & time 09/23/14  1542 History   First MD Initiated Contact with Patient 09/23/14 1631     Chief Complaint  Patient presents with  . Abdominal Pain  . Headache   HPI  Patient is a 29 year old male with past medical history of anxiety presents emergency room for evaluation of abdominal pain and dental pain. Patient states that since Tuesday he has been having intermittent epigastric and left upper quadrant abdominal pain which is generally worse after eating. He states that his pain is associated with water brash and intermittent heartburn. He denies nausea, vomiting, and diarrhea. Patient states that he ate at [employer] on Tuesday with his sister who is also having similar symptoms. He has not tried any medications at home. Patient states he has not had any changes in his diet. He is also complaining of dental pain. He states that his left lower molar broke off this morning. He has been having issues with this tooth for some time. He denies fevers, drooling, trismus, difficulty swallowing, or drainage from around the tooth. Patient is not seen a dentist in some time. Patient also complaining of some numbness beneath the scar on his forehead. Patient states that he has been intermittently numb for some time. Nothing seems to make the numbness better or worse.  Past Medical History  Diagnosis Date  . Anxiety    History reviewed. No pertinent past surgical history. History reviewed. No pertinent family history. History  Substance Use Topics  . Smoking status: Current Some Day Smoker    Types: Cigars  . Smokeless tobacco: Never Used  . Alcohol Use: Yes     Comment: Socially    Review of Systems  Constitutional: Negative for fever, chills and fatigue.  HENT: Positive for dental problem. Negative for trouble swallowing.   Respiratory: Negative for chest tightness and shortness of breath.   Cardiovascular: Negative for chest pain.  Gastrointestinal: Positive  for abdominal pain. Negative for nausea, vomiting, diarrhea and constipation.  Genitourinary: Negative for dysuria, urgency, frequency, hematuria and difficulty urinating.  Neurological: Positive for numbness.  All other systems reviewed and are negative.     Allergies  Review of patient's allergies indicates no known allergies.  Home Medications   Prior to Admission medications   Medication Sig Start Date End Date Taking? Authorizing Provider  Multiple Vitamin (MULTIVITAMIN WITH MINERALS) TABS tablet Take 1 tablet by mouth daily.   Yes Historical Provider, MD  amoxicillin (AMOXIL) 500 MG capsule Take 1 capsule (500 mg total) by mouth 3 (three) times daily. 09/23/14   Samyukta Cura A Forcucci, PA-C  fluticasone (FLONASE) 50 MCG/ACT nasal spray Place 2 sprays into both nostrils daily. Patient not taking: Reported on 07/01/2014 05/02/14   Patty Sermons Camprubi-Soms, PA-C  HYDROcodone-acetaminophen (NORCO) 5-325 MG per tablet Take 1-2 tablets by mouth every 6 (six) hours as needed for severe pain. Patient not taking: Reported on 07/01/2014 05/02/14   Patty Sermons Camprubi-Soms, PA-C  LORazepam (ATIVAN) 1 MG tablet Take 1 tablet (1 mg total) by mouth every 6 (six) hours as needed for anxiety. Patient not taking: Reported on 08/29/2014 07/01/14   Elwyn Lade, PA-C  naproxen (NAPROSYN) 500 MG tablet Take 1 tablet (500 mg total) by mouth 2 (two) times daily as needed for mild pain, moderate pain or headache (TAKE WITH MEALS.). Patient not taking: Reported on 07/01/2014 05/02/14   Patty Sermons Camprubi-Soms, PA-C  omeprazole (PRILOSEC) 20 MG capsule Take 1 capsule (20 mg  total) by mouth daily. 09/23/14   Courtney A Forcucci, PA-C  Sodium Chloride-Sodium Bicarb (NETI POT SINUS WASH) 2300-700 MG KIT Place 1 application into the nose 3 (three) times daily as needed (sinus congestion). Patient not taking: Reported on 07/01/2014 05/02/14   Patty Sermons Camprubi-Soms, PA-C  sucralfate (CARAFATE) 1  G tablet Take 1 tablet (1 g total) by mouth 4 (four) times daily -  with meals and at bedtime. 09/23/14   Courtney A Forcucci, PA-C  traMADol (ULTRAM) 50 MG tablet Take 1 tablet (50 mg total) by mouth every 6 (six) hours as needed. 09/23/14   Courtney A Forcucci, PA-C   BP 118/63 mmHg  Pulse 50  Temp(Src) 98.1 F (36.7 C) (Oral)  Resp 14  SpO2 100% Physical Exam  Constitutional: He is oriented to person, place, and time. He appears well-developed and well-nourished. No distress.  HENT:  Head: Normocephalic and atraumatic.  Mouth/Throat: Uvula is midline, oropharynx is clear and moist and mucous membranes are normal. No trismus in the jaw. Dental caries present. No uvula swelling. No oropharyngeal exudate.    Eyes: Conjunctivae and EOM are normal. Pupils are equal, round, and reactive to light. No scleral icterus.  Neck: Normal range of motion. Neck supple. No JVD present. No thyromegaly present.  Cardiovascular: Normal rate, regular rhythm, normal heart sounds and intact distal pulses.  Exam reveals no gallop and no friction rub.   No murmur heard. Pulmonary/Chest: Effort normal and breath sounds normal. No respiratory distress. He has no wheezes. He has no rales. He exhibits no tenderness.  Abdominal: Soft. Normal appearance and bowel sounds are normal. He exhibits no distension and no mass. There is no hepatosplenomegaly. There is tenderness in the epigastric area and left upper quadrant. There is no rebound, no guarding and no CVA tenderness.  Musculoskeletal: Normal range of motion.  Lymphadenopathy:    He has no cervical adenopathy.  Neurological: He is alert and oriented to person, place, and time.  Skin: Skin is warm and dry. He is not diaphoretic.  Psychiatric: He has a normal mood and affect. His behavior is normal. Judgment and thought content normal.  Nursing note and vitals reviewed.   ED Course  Procedures (including critical care time) Labs Review Labs Reviewed  CBC  WITH DIFFERENTIAL/PLATELET - Abnormal; Notable for the following:    WBC 3.9 (*)    Neutrophils Relative % 39 (*)    Neutro Abs 1.5 (*)    Lymphocytes Relative 51 (*)    All other components within normal limits  COMPREHENSIVE METABOLIC PANEL - Abnormal; Notable for the following:    GFR calc non Af Amer 90 (*)    All other components within normal limits  LIPASE, BLOOD  URINALYSIS, ROUTINE W REFLEX MICROSCOPIC    Imaging Review No results found.   EKG Interpretation None      MDM   Final diagnoses:  Pain, dental  Gastritis   Patient is a 29 year old male who presents emergency room for evaluation of abdominal pain and dental pain. Physical exam reveals an alert nontoxic-appearing male with a soft and minimally tender epigastric and left upper quadrant abdomen. There is no evidence for guarding, rigidity, or peritoneal signs on examination. CBC, CMP, lipase and urinalysis are unremarkable. Patient does have mild lymphocytosis. Suspect this may be viral induced gastritis. Patient given GI cocktail here with some relief of his pain. We'll discharge patient home with omeprazole and Carafate for gastritis coverage. We'll discharge home with amoxicillin for prevention of  periapical abscess and was given Ultram for pain. Patient return for hematemesis, coffee-ground emesis, inability to open his mouth, facial swelling, or difficulty swallowing. Patient states understanding and agreement at this time. We'll give the patient a resource list for dental and primary care doctors. Patient follow-up with a dentist and a doctor of his choosing. Patient discussed with Dr. Winfred Leeds who agrees with the above workup and plan.     Cherylann Parr, PA-C 09/23/14 [place], MD 09/24/14 [phone]

## 2014-09-23 NOTE — ED Notes (Signed)
Pt reports intermittent abd pain since Tuesday. No n/v, but has diarrhea after he eats. Pain is in LUQ. Pt also reports he has posterior head "tingling." Also reports he has dental pain on L lower molar.

## 2014-09-23 NOTE — Progress Notes (Signed)
  CARE MANAGEMENT ED NOTE 09/23/2014  Patient:  Todd Riggs,Todd Riggs   Account Number:  [id number]  Date Initiated:  09/23/2014  Documentation initiated by:  Edd ArbourGIBBS,Concetta Guion  Subjective/Objective Assessment:   29 yr old male resident of [place] listed with [employer] insurance but pt states he did not pay his January [employer] premium (related to IRS issues- Closed account) & coverage is not active although ED Registration lists [employer]     Subjective/Objective Assessment Detail:   no pcp  [facility] staff     Action/Plan:   spoke with pt rechecked cigna registration status E verification denied on second verification attempt   Action/Plan Detail:   Anticipated DC Date:       Status Recommendation to Physician:   Result of Recommendation:    Other ED Services  Consult Working Psychologist, educationallan    DC Planning Services  Other  Outpatient Services - Pt will follow up  PCP issues  [facility] / [facility] (established/new)    Choice offered to / List presented to:            Status of service:  Completed, signed off  ED Comments:   ED Comments Detail:  CM spoke with pt who [place]. CM discussed and provided written information for self pay pcps, importance of pcp for f/u care, www.needymeds.org, www.goodrx.com, discounted pharmacies and other [place] resources such as [facility], [facility], affordable care act,  financial assistance, DSS and  health department  Reviewed resources for [place] self pay pcps like [facility] [place], family medicine at [address], [facility], general medical clinics, [facility] urgent care plus others, medication resources, [facility] out patient pharmacies and housing Pt voiced understanding and appreciation of resources provided  Provided [facility] contact information seen by P[facility] staff while in ED given [facility] application and resources

## 2014-10-26 ENCOUNTER — Emergency Department (HOSPITAL_COMMUNITY)
Admission: EM | Admit: 2014-10-26 | Discharge: 2014-10-26 | Disposition: A | Discharge status: Home / Self Care | Payer: Managed Care, Other (non HMO) | Attending: Emergency Medicine | Admitting: Emergency Medicine

## 2014-10-26 ENCOUNTER — Encounter (HOSPITAL_COMMUNITY): Payer: Self-pay

## 2014-10-26 DIAGNOSIS — Z792 Long term (current) use of antibiotics: Secondary | ICD-10-CM | POA: Insufficient documentation

## 2014-10-26 DIAGNOSIS — F419 Anxiety disorder, unspecified: Secondary | ICD-10-CM | POA: Insufficient documentation

## 2014-10-26 DIAGNOSIS — Z720 Tobacco use: Secondary | ICD-10-CM | POA: Insufficient documentation

## 2014-10-26 MED ORDER — LORAZEPAM 0.5 MG PO TABS: 1.0000 mg | ORAL_TABLET | Freq: Three times a day (TID) | ORAL | Status: DC | PRN

## 2014-10-26 NOTE — ED Notes (Addendum)
Pt reports sOB x 2 days.  Denies hx of asthma.  Denies any pain but reports tightness in his chest.    Pt is A&Ox 4, in NAD

## 2014-10-26 NOTE — Discharge Instructions (Signed)
Generalized Anxiety Disorder Generalized anxiety disorder (GAD) is a mental disorder. It interferes with life functions, including relationships, work, and school. GAD is different from normal anxiety, which everyone experiences at some point in their lives in response to specific life events and activities. Normal anxiety actually helps us prepare for and get through these life events and activities. Normal anxiety goes away after the event or activity is over.  GAD causes anxiety that is not necessarily related to specific events or activities. It also causes excess anxiety in proportion to specific events or activities. The anxiety associated with GAD is also difficult to control. GAD can vary from mild to severe. People with severe GAD can have intense waves of anxiety with physical symptoms (panic attacks).  SYMPTOMS The anxiety and worry associated with GAD are difficult to control. This anxiety and worry are related to many life events and activities and also occur more days than not for 6 months or longer. People with GAD also have three or more of the following symptoms (one or more in children):  Restlessness.   Fatigue.  Difficulty concentrating.   Irritability.  Muscle tension.  Difficulty sleeping or unsatisfying sleep. DIAGNOSIS GAD is diagnosed through an assessment by your health care provider. Your health care provider will ask you questions aboutyour mood,physical symptoms, and events in your life. Your health care provider may ask you about your medical history and use of alcohol or drugs, including prescription medicines. Your health care provider may also do a physical exam and blood tests. Certain medical conditions and the use of certain substances can cause symptoms similar to those associated with GAD. Your health care provider may refer you to a mental health specialist for further evaluation. TREATMENT The following therapies are usually used to treat GAD:    Medication. Antidepressant medication usually is prescribed for long-term daily control. Antianxiety medicines may be added in severe cases, especially when panic attacks occur.   Talk therapy (psychotherapy). Certain types of talk therapy can be helpful in treating GAD by providing support, education, and guidance. A form of talk therapy called cognitive behavioral therapy can teach you healthy ways to think about and react to daily life events and activities.  Stress managementtechniques. These include yoga, meditation, and exercise and can be very helpful when they are practiced regularly. A mental health specialist can help determine which treatment is best for you. Some people see improvement with one therapy. However, other people require a combination of therapies. Document Released: 11/16/2012 Document Revised: 12/06/2013 Document Reviewed: 11/16/2012 [facility] Patient Information 2015 [employer] [place]. This information is not intended to replace advice given to you by your health care provider. Make sure you discuss any questions you have with your health care provider.  Emergency Department Resource Guide 1) Find a Doctor and Pay Out of Pocket Although you won't have to find out who is covered by your insurance plan, it is a good idea to ask around and get recommendations. You will then need to call the office and see if the doctor you have chosen will accept you as a new patient and what types of options they offer for patients who are self-pay. Some doctors offer discounts or will set up payment plans for their patients who do not have insurance, but you will need to ask so you aren't surprised when you get to your appointment.  2) Contact Your Local Health Department Not all health departments have doctors that can see patients for sick visits, but many  do, so it is worth a call to see if yours does. If you don't know where your local health department is, you can check in your phone  book. The CDC also has a tool to help you locate your state's health department, and many state websites also have listings of all of their local health departments.  3) Find a Walk-in Clinic If your illness is not likely to be very severe or complicated, you may want to try a walk in clinic. These are popping up all over the country in pharmacies, drugstores, and shopping centers. They're usually staffed by nurse practitioners or physician assistants that have been trained to treat common illnesses and complaints. They're usually fairly quick and inexpensive. However, if you have serious medical issues or chronic medical problems, these are probably not your best option.  No Primary Care Doctor: - Call Health Connect at  [phone] - they can help you locate a primary care doctor that  accepts your insurance, provides certain services, etc. - Physician Referral Service- [phone]  Chronic Pain Problems: Organization         Address  Phone   Notes  [facility]  [phone] Patients need to be referred by their primary care doctor.   Medication Assistance: Organization         Address  Phone   Notes  [facility] Medication [facility] [address] [place], [place] [address] [phone] --Must be a resident of [facility] -- Must have NO insurance coverage whatsoever (no Medicaid/ Medicare, etc.) -- The pt. MUST have a primary care doctor that directs their care regularly and follows them in the community   [facility]  [phone]   [employer]  [phone]    Agencies that provide inexpensive medical care: Organization         Address  Phone   Notes  [facility]  [phone]   [facility]    [phone]   [facility] [address] [place], [place] [address] [phone]   [facility] [address] [place] [place] [phone]   Planned  Parenthood    [phone]   [facility]    [phone]   [facility]  [address], [place] Phone:  [phone], Fax:  [phone] Hours of Operation:  9 am - 6 pm, M-F.  Also accepts Medicaid/Medicare and self-pay.  [facility]  [address], [place] Phone: [phone], Fax: [phone]. Hours of Operation:  8:30 am - 5:30 pm, M-F.  Also accepts Medicaid and self-pay.  [facility] [place] [address] [place] Phone: [phone]   [facility] [address] Natasha Bence [place], [place] [phone], Ext. [phone] Mondays & Thursdays: 7-9 AM.  First 15 patients are seen on a first come, first serve basis.    Medicaid-accepting [facility] Providers:  Organization         Address  Phone   Notes  [facility] [address], [place] [phone] Also accepts self-pay patients.  [facility] [address] Laurell Josephs [place], [place]  [phone]   [facility] [address], [place] [phone]   [facility] [address], [place] [phone]   Renaye Rakers  [address], [place]   [phone] Only accepts [place] patients after they have their name applied to their card.   Self-Pay (no insurance) in [facility]:  Organization         Address  Phone   Notes  Sickle Cell Patients, [facility] [address] [place], [place] [phone]   [facility] [address] [place], [place] [phone]   [facility] [place]  [address], [place] [phone]   [facility]/Dr. Osei-Bonsu  [address], [place] or [address], [place] [phone] Phone number for both [place] and [place] locations is the same.   [facility] [address] [place] [phone]   [facility] [address], [place] or [address] [phone]   [facility] [address] [place] [phone], phone; [phone], fax Sees patients 1st and 3rd Saturday of every month.  Must not qualify for public or private insurance (i.e. Medicaid, Medicare, [place] Health Choice, Veterans' Benefits)  Household income should be no more than 200% of the poverty level The clinic cannot treat you if you are pregnant or think you are pregnant  Sexually transmitted diseases are not treated at the clinic.

## 2014-10-26 NOTE — ED Provider Notes (Signed)
CSN: [id number]     Arrival date & time 10/26/14  1856 History  This chart was scribed for non-physician practitioner Charlann Lange, PA-C working with Lacretia Leigh, MD by Rayfield Citizen, ED Scribe. This patient was seen in room WTR8/WTR8 and the patient's care was started at 8:12 PM.    Chief Complaint  Patient presents with  . Shortness of Breath   Patient is a 29 y.o. male presenting with shortness of breath. The history is provided by the patient. No language interpreter was used.  Shortness of Breath Associated symptoms: abdominal pain   Associated symptoms: no cough, no fever and no vomiting      HPI Comments: Todd Riggs is a 29 y.o. male who presents to the Emergency Department complaining of SOB, chest tightness, and intermittent palpitations. He experiences this issue approximately once a week; he states he usually treats his symptoms with rest but has had difficulty sleeping with his current symptoms, prompting him to come to the ED today. He was last seen here for this issue on 07/01/14; patient states that the medication he was prescribed at that visit improved his discomfort but he has not followed up with outpatient appointments as previously directed. He does note that he is under some "pressure" at home. He denies fever, cough, congestion, rhinorrhea, sneezing, vomiting, personal history of asthma.  He also notes intermittent "sharp" abdominal pains with associated urge to defecate; he believes this may be related to his recent cigarette smoking.   Past Medical History  Diagnosis Date  . Anxiety    History reviewed. No pertinent past surgical history. History reviewed. No pertinent family history. History  Substance Use Topics  . Smoking status: Current Some Day Smoker    Types: Cigars  . Smokeless tobacco: Never Used  . Alcohol Use: Yes     Comment: Socially    Review of Systems  Constitutional: Negative for fever.  HENT: Negative for congestion, rhinorrhea and  sneezing.   Respiratory: Positive for chest tightness and shortness of breath. Negative for cough.   Cardiovascular: Positive for palpitations.  Gastrointestinal: Positive for abdominal pain. Negative for vomiting.    Allergies  Review of patient's allergies indicates no known allergies.  Home Medications   Prior to Admission medications   Medication Sig Start Date End Date Taking? Authorizing Provider  amoxicillin (AMOXIL) 500 MG capsule Take 1 capsule (500 mg total) by mouth 3 (three) times daily. 09/23/14  Yes Courtney Forcucci, PA-C  Multiple Vitamin (MULTIVITAMIN WITH MINERALS) TABS tablet Take 1 tablet by mouth daily.   Yes Historical Provider, MD  fluticasone (FLONASE) 50 MCG/ACT nasal spray Place 2 sprays into both nostrils daily. Patient not taking: Reported on 07/01/2014 05/02/14   Mercedes Camprubi-Soms, PA-C  HYDROcodone-acetaminophen (NORCO) 5-325 MG per tablet Take 1-2 tablets by mouth every 6 (six) hours as needed for severe pain. Patient not taking: Reported on 07/01/2014 05/02/14   Mercedes Camprubi-Soms, PA-C  LORazepam (ATIVAN) 1 MG tablet Take 1 tablet (1 mg total) by mouth every 6 (six) hours as needed for anxiety. Patient not taking: Reported on 08/29/2014 07/01/14   Cleatrice Burke, PA-C  naproxen (NAPROSYN) 500 MG tablet Take 1 tablet (500 mg total) by mouth 2 (two) times daily as needed for mild pain, moderate pain or headache (TAKE WITH MEALS.). Patient not taking: Reported on 07/01/2014 05/02/14   Mercedes Camprubi-Soms, PA-C  omeprazole (PRILOSEC) 20 MG capsule Take 1 capsule (20 mg total) by mouth daily. Patient not taking: Reported on 10/26/2014 09/23/14  Courtney Forcucci, PA-C  Sodium Chloride-Sodium Bicarb (NETI POT SINUS [place]) 2300-700 MG KIT Place 1 application into the nose 3 (three) times daily as needed (sinus congestion). Patient not taking: Reported on 07/01/2014 05/02/14   Mercedes Camprubi-Soms, PA-C  sucralfate (CARAFATE) 1 G tablet Take 1 tablet (1 g  total) by mouth 4 (four) times daily -  with meals and at bedtime. Patient not taking: Reported on 10/26/2014 09/23/14   Loma Sousa Forcucci, PA-C  traMADol (ULTRAM) 50 MG tablet Take 1 tablet (50 mg total) by mouth every 6 (six) hours as needed. Patient not taking: Reported on 10/26/2014 09/23/14   Loma Sousa Forcucci, PA-C   BP 122/76 mmHg  Pulse 55  Temp(Src) 98.2 F (36.8 C) (Oral)  Resp 16  SpO2 99% Physical Exam  Constitutional: He is oriented to person, place, and time. He appears well-developed and well-nourished. No distress.  HENT:  Head: Normocephalic and atraumatic.  Mouth/Throat: Oropharynx is clear and moist. No oropharyngeal exudate.  Moist mucous membranes  Eyes: EOM are normal. Pupils are equal, round, and reactive to light.  Neck: Normal range of motion. Neck supple. No JVD present.  Cardiovascular: Normal rate, regular rhythm and normal heart sounds.  Exam reveals no gallop and no friction rub.   No murmur heard. Pulmonary/Chest: Effort normal and breath sounds normal. No respiratory distress. He has no wheezes. He has no rales.  Abdominal: Soft. Bowel sounds are normal. He exhibits no mass. There is no tenderness. There is no rebound and no guarding.  Neurological: He is alert and oriented to person, place, and time.  Skin: Skin is warm and dry. No rash noted.  Psychiatric: He has a normal mood and affect. His behavior is normal.  Nursing note and vitals reviewed.   ED Course  Procedures   DIAGNOSTIC STUDIES: Oxygen Saturation is 99% on RA, normal by my interpretation.    COORDINATION OF CARE: 8:18 PM Discussed treatment plan with pt at bedside and pt agreed to plan.   Labs Review Labs Reviewed - No data to display  Imaging Review No results found.   EKG Interpretation None      MDM   Final diagnoses:  None  1. Anxiety  Patient returns to the ED with recurrent symptoms of non-exertional SOB, periodic palpitations, that is improved with medications  provided in the ED previously. Chart reviewed; previous d-dimers, CXR, lab studies negative. VS normal, no hypoxia. Strongly encouraged PCP follow up for recurrent symptoms.   I personally performed the services described in this documentation, which was scribed in my presence. The recorded information has been reviewed and is accurate.       Charlann Lange, PA-C 10/26/14 2042  Lacretia Leigh, MD 10/27/14 [phone]

## 2014-10-26 NOTE — ED Notes (Addendum)
Patient reports feeling short of breath today.  No respiratory distress noted.  Poor historian.  Reports he has been taking ABX "off and on" for a broken tooth.  Denies any respiratory issues.  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  He states he has trouble sleeping when he gets off work.  Denies feeling anxious at present time.

## 2014-11-09 ENCOUNTER — Emergency Department (HOSPITAL_COMMUNITY): Payer: Managed Care, Other (non HMO)

## 2014-11-09 ENCOUNTER — Emergency Department (HOSPITAL_COMMUNITY)
Admission: EM | Admit: 2014-11-09 | Discharge: 2014-11-09 | Disposition: A | Discharge status: Home / Self Care | Payer: Managed Care, Other (non HMO) | Attending: Emergency Medicine | Admitting: Emergency Medicine

## 2014-11-09 ENCOUNTER — Encounter (HOSPITAL_COMMUNITY): Payer: Self-pay | Admitting: Emergency Medicine

## 2014-11-09 DIAGNOSIS — R5383 Other fatigue: Secondary | ICD-10-CM | POA: Insufficient documentation

## 2014-11-09 DIAGNOSIS — R0600 Dyspnea, unspecified: Secondary | ICD-10-CM | POA: Insufficient documentation

## 2014-11-09 DIAGNOSIS — Z720 Tobacco use: Secondary | ICD-10-CM | POA: Insufficient documentation

## 2014-11-09 DIAGNOSIS — F410 Panic disorder [episodic paroxysmal anxiety] without agoraphobia: Secondary | ICD-10-CM | POA: Insufficient documentation

## 2014-11-09 DIAGNOSIS — Z79899 Other long term (current) drug therapy: Secondary | ICD-10-CM | POA: Insufficient documentation

## 2014-11-09 DIAGNOSIS — Z7951 Long term (current) use of inhaled steroids: Secondary | ICD-10-CM | POA: Insufficient documentation

## 2014-11-09 DIAGNOSIS — R0981 Nasal congestion: Secondary | ICD-10-CM | POA: Insufficient documentation

## 2014-11-09 DIAGNOSIS — Z792 Long term (current) use of antibiotics: Secondary | ICD-10-CM | POA: Insufficient documentation

## 2014-11-09 DIAGNOSIS — R05 Cough: Secondary | ICD-10-CM | POA: Insufficient documentation

## 2014-11-09 HISTORY — DX: Panic disorder (episodic paroxysmal anxiety): F41.0

## 2014-11-09 NOTE — ED Notes (Signed)
Pt c/o dry cough and shortness of breath since February when he started a new job. Pt has "bad allergies" and works in a dusty environment. Has tried claritin without relief. Lung sounds clear

## 2014-11-09 NOTE — Discharge Instructions (Signed)

## 2014-11-09 NOTE — ED Notes (Signed)
Pt. reports chronic SOB with occasional productive cough onset February this year , pt. stated he feels short of breath only at work due to dusty environment [employer]( [employer] )  , respirations unlabored at triage .

## 2014-11-09 NOTE — ED Provider Notes (Signed)
CSN: [id number]     Arrival date & time 11/09/14  0055 History  This chart was scribed for Debby Freiberg, MD by Eustaquio Maize, ED Scribe. This patient was seen in room D33C/D33C and the patient's care was started at 3:32 AM.     Chief Complaint  Patient presents with  . Shortness of Breath  . Cough   Patient is a 29 y.o. male presenting with shortness of breath and cough. The history is provided by the patient. No language interpreter was used.  Shortness of Breath Severity:  Unable to specify Onset quality:  Gradual Duration:  2 months Timing:  Intermittent Progression:  Worsening Chronicity:  Chronic Context: occupational exposure   Associated symptoms: cough   Associated symptoms: no chest pain, no fever, no sore throat and no vomiting   Cough Associated symptoms: shortness of breath   Associated symptoms: no chest pain, no fever and no sore throat      HPI Comments: Wataru Mccowen is a 29 y.o. male who presents to the Emergency Department complaining of chronic shortness of breath that began in February (2 months ago) since starting a new job. Pt reports he works in a dusty environment. He has tried Claritin without any relief. Pt mentions that the shortness of breath worsened 2 days ago, causing him to come to the ED. He also complains of a dry cough, congestion, and fatigue. He mentions that his symptoms are relieved when he isn't at work. He denies fever, sore throat, chest pain, nausea, vomiting, diarrhea or any other symptoms. Pt came in tonight because he needs a doctor's note to change departments for work.    Past Medical History  Diagnosis Date  . Anxiety   . Panic attack    History reviewed. No pertinent past surgical history. No family history on file. History  Substance Use Topics  . Smoking status: Current Some Day Smoker    Types: Cigars  . Smokeless tobacco: Never Used  . Alcohol Use: Yes     Comment: Socially    Review of Systems  Constitutional:  Positive for fatigue. Negative for fever.  HENT: Positive for congestion. Negative for sore throat.   Respiratory: Positive for cough and shortness of breath.   Cardiovascular: Negative for chest pain.  Gastrointestinal: Negative for nausea, vomiting and diarrhea.  All other systems reviewed and are negative.     Allergies  Review of patient's allergies indicates no known allergies.  Home Medications   Prior to Admission medications   Medication Sig Start Date End Date Taking? Authorizing Provider  LORazepam (ATIVAN) 0.5 MG tablet Take 2 tablets (1 mg total) by mouth every 8 (eight) hours as needed for anxiety. 10/26/14  Yes Charlann Lange, PA-C  Multiple Vitamin (MULTIVITAMIN WITH MINERALS) TABS tablet Take 1 tablet by mouth daily.   Yes Historical Provider, MD  amoxicillin (AMOXIL) 500 MG capsule Take 1 capsule (500 mg total) by mouth 3 (three) times daily. Patient not taking: Reported on 11/09/2014 09/23/14   Loma Sousa Forcucci, PA-C  fluticasone (FLONASE) 50 MCG/ACT nasal spray Place 2 sprays into both nostrils daily. Patient not taking: Reported on 07/01/2014 05/02/14   Mercedes Camprubi-Soms, PA-C  HYDROcodone-acetaminophen (NORCO) 5-325 MG per tablet Take 1-2 tablets by mouth every 6 (six) hours as needed for severe pain. Patient not taking: Reported on 07/01/2014 05/02/14   Mercedes Camprubi-Soms, PA-C  naproxen (NAPROSYN) 500 MG tablet Take 1 tablet (500 mg total) by mouth 2 (two) times daily as needed for mild pain, moderate  pain or headache (TAKE WITH MEALS.). Patient not taking: Reported on 07/01/2014 05/02/14   Mercedes Camprubi-Soms, PA-C  omeprazole (PRILOSEC) 20 MG capsule Take 1 capsule (20 mg total) by mouth daily. Patient not taking: Reported on 10/26/2014 09/23/14   Starlyn Skeans, PA-C  Sodium Chloride-Sodium Bicarb (NETI POT SINUS [place]) 2300-700 MG KIT Place 1 application into the nose 3 (three) times daily as needed (sinus congestion). Patient not taking: Reported on  07/01/2014 05/02/14   Mercedes Camprubi-Soms, PA-C  sucralfate (CARAFATE) 1 G tablet Take 1 tablet (1 g total) by mouth 4 (four) times daily -  with meals and at bedtime. Patient not taking: Reported on 10/26/2014 09/23/14   Loma Sousa Forcucci, PA-C  traMADol (ULTRAM) 50 MG tablet Take 1 tablet (50 mg total) by mouth every 6 (six) hours as needed. Patient not taking: Reported on 10/26/2014 09/23/14   Starlyn Skeans, PA-C   Triage Vitals: BP 123/85 mmHg  Pulse 64  Temp(Src) 97.7 F (36.5 C) (Oral)  Resp 16  SpO2 99%   Physical Exam  Constitutional: He is oriented to person, place, and time. He appears well-developed and well-nourished.  HENT:  Head: Normocephalic and atraumatic.  Eyes: Conjunctivae and EOM are normal.  Neck: Normal range of motion. Neck supple.  Cardiovascular: Normal rate, regular rhythm and normal heart sounds.   Pulmonary/Chest: Effort normal and breath sounds normal. No respiratory distress.  Abdominal: He exhibits no distension. There is no tenderness. There is no rebound and no guarding.  Musculoskeletal: Normal range of motion.  Neurological: He is alert and oriented to person, place, and time.  Skin: Skin is warm and dry.  Vitals reviewed.   ED Course  Procedures (including critical care time)  DIAGNOSTIC STUDIES: Oxygen Saturation is 99% on RA, normal by my interpretation.    COORDINATION OF CARE: 3:37 AM-Discussed treatment plan which includes referral to [facility] with pt at bedside and pt agreed to plan.   Labs Review Labs Reviewed - No data to display  Imaging Review Dg Chest 2 View  11/09/2014   CLINICAL DATA:  Chest discomfort. Shortness of breath. History of panic attacks.  EXAM: CHEST  2 VIEW  COMPARISON:  08/29/2014  FINDINGS: The heart size and mediastinal contours are within normal limits. Both lungs are clear. The visualized skeletal structures are unremarkable.  IMPRESSION: No active cardiopulmonary disease.   Electronically  Signed   By: Lucienne Capers M.D.   On: 11/09/2014 01:40     EKG Interpretation None      MDM   Final diagnoses:  Dyspnea    29 y.o. male without pertinent PMH presents with dyspnea and cough while at work.  Minimal to no symptoms outside of work.  No fever, hematemesis, or chest pain.  Physical exam on arrival as above.  Benign exam.  No signs of asthma or other chronic conditions, likely irritation from inhaled dust.  Counseled that if this was irritating, he should probably not continue to do the same things, and at minimum should wear a mask.  DC home in stable condition.    I have reviewed all laboratory and imaging studies if ordered as above  1. Dyspnea          Debby Freiberg, MD 11/12/14 1725

## 2014-12-05 ENCOUNTER — Encounter (HOSPITAL_COMMUNITY): Payer: Self-pay | Admitting: Emergency Medicine

## 2014-12-05 ENCOUNTER — Emergency Department (HOSPITAL_COMMUNITY): Payer: Managed Care, Other (non HMO)

## 2014-12-05 ENCOUNTER — Emergency Department (HOSPITAL_COMMUNITY)
Admission: EM | Admit: 2014-12-05 | Discharge: 2014-12-05 | Disposition: A | Discharge status: Home / Self Care | Payer: Managed Care, Other (non HMO) | Attending: Emergency Medicine | Admitting: Emergency Medicine

## 2014-12-05 DIAGNOSIS — R079 Chest pain, unspecified: Secondary | ICD-10-CM | POA: Insufficient documentation

## 2014-12-05 DIAGNOSIS — Z87891 Personal history of nicotine dependence: Secondary | ICD-10-CM | POA: Insufficient documentation

## 2014-12-05 DIAGNOSIS — R0600 Dyspnea, unspecified: Secondary | ICD-10-CM | POA: Insufficient documentation

## 2014-12-05 DIAGNOSIS — F410 Panic disorder [episodic paroxysmal anxiety] without agoraphobia: Secondary | ICD-10-CM | POA: Insufficient documentation

## 2014-12-05 LAB — BASIC METABOLIC PANEL
Anion gap: 10 (ref 5–15)
BUN: 12 mg/dL (ref 6–20)
CO2: 25 mmol/L (ref 22–32)
CREATININE: 1.37 mg/dL — AB (ref 0.61–1.24)
Calcium: 9.7 mg/dL (ref 8.9–10.3)
Chloride: 101 mmol/L (ref 101–111)
GFR calc Af Amer: >60 mL/min (ref >60)
GFR calc non Af Amer: >60 mL/min (ref >60)
Glucose, Bld: 93 mg/dL (ref 70–99)
Potassium: 3.6 mmol/L (ref 3.5–5.1)
SODIUM: 136 mmol/L (ref 135–145)

## 2014-12-05 LAB — CBC
HCT: 38.9 % — ABNORMAL LOW (ref 39.0–52.0)
HEMOGLOBIN: 13.4 g/dL (ref 13.0–17.0)
MCH: 28.1 pg (ref 26.0–34.0)
MCHC: 34.4 g/dL (ref 30.0–36.0)
MCV: 81.6 fL (ref 78.0–100.0)
PLATELETS: 223 10*3/uL (ref 150–400)
RBC: 4.77 MIL/uL (ref 4.22–5.81)
RDW: 12.3 % (ref 11.5–15.5)
WBC: 4.9 10*3/uL (ref 4.0–10.5)

## 2014-12-05 LAB — I-STAT TROPONIN, ED: Troponin i, poc: 0 ng/mL (ref 0.00–0.08)

## 2014-12-05 LAB — BRAIN NATRIURETIC PEPTIDE: B NATRIURETIC PEPTIDE 5: 13 pg/mL (ref 0.0–100.0)

## 2014-12-05 NOTE — ED Notes (Signed)
C/o sob x 1 week that is worse at night.  Denies cough.  States he quit smoking 2 months ago.

## 2014-12-05 NOTE — ED Provider Notes (Signed)
CSN: [id number]     Arrival date & time 12/05/14  0216 History  This chart was scribed for Zadie Rhineonald Shmuel Girgis, MD by Annye AsaAnna Dorsett, ED Scribe. This patient was seen in room B15C/B15C and the patient's care was started at 3:53 AM.    Chief Complaint  Patient presents with  . Shortness of Breath   Patient is a 29 y.o. male presenting with shortness of breath. The history is provided by the patient. No language interpreter was used.  Shortness of Breath Severity:  Moderate Onset quality:  Gradual Timing:  Intermittent Progression:  Unchanged Chronicity:  New Context: occupational exposure and smoke exposure   Context: not activity   Relieved by:  None tried Exacerbated by: Resting. Ineffective treatments:  None tried Associated symptoms: chest pain   Associated symptoms: no cough, no fever, no hemoptysis and no vomiting     HPI Comments: Todd Riggs is an otherwise healthy 29 y.o. male who presents to the Emergency Department complaining of several weeks of SOB and mild intermittent chest pain. Patient reports that his symptoms occur only while resting; he does not experince SOB while ambulating or working during the day. He explains that he is regularly around fumes and cigarette smoke at work, which he believes exacerbates his symptoms. He denies syncope, fevers, vomiting, hemoptysis, lower extremity swelling, recent travel or surgery.   No PCP at present. Patient is a former smoker; quit date was associated with transient relief in symptoms, but they have not resolved.   Past Medical History  Diagnosis Date  . Anxiety   . Panic attack    History reviewed. No pertinent past surgical history. No family history on file. History  Substance Use Topics  . Smoking status: Former Smoker    Types: Cigars    Quit date: 10/05/2014  . Smokeless tobacco: Never Used  . Alcohol Use: Yes     Comment: Socially    Review of Systems  Constitutional: Negative for fever.  Respiratory: Positive  for shortness of breath. Negative for cough and hemoptysis.   Cardiovascular: Positive for chest pain. Negative for leg swelling.  Gastrointestinal: Negative for vomiting.  Neurological: Negative for syncope.  All other systems reviewed and are negative.   Allergies  Review of patient's allergies indicates no known allergies.  Home Medications   Prior to Admission medications   Medication Sig Start Date End Date Taking? Authorizing Provider  LORazepam (ATIVAN) 0.5 MG tablet Take 2 tablets (1 mg total) by mouth every 8 (eight) hours as needed for anxiety. 10/26/14   Elpidio AnisShari Upstill, PA-C  Multiple Vitamin (MULTIVITAMIN WITH MINERALS) TABS tablet Take 1 tablet by mouth daily.    Historical Provider, MD  naproxen (NAPROSYN) 500 MG tablet Take 1 tablet (500 mg total) by mouth 2 (two) times daily as needed for mild pain, moderate pain or headache (TAKE WITH MEALS.). Patient not taking: Reported on 07/01/2014 05/02/14   Mercedes Camprubi-Soms, PA-C   BP 119/73 mmHg  Pulse 49  Temp(Src) 97.6 F (36.4 C) (Oral)  Resp 14  Ht 6\' 2"  (1.88 m)  Wt 220 lb (99.791 kg)  BMI 28.23 kg/m2  SpO2 97% Physical Exam  Nursing note and vitals reviewed.   CONSTITUTIONAL: Well developed/well nourished HEAD: Normocephalic/atraumatic EYES: EOMI/PERRL ENMT: Mucous membranes moist NECK: supple no meningeal signs SPINE/BACK:entire spine nontender CV: S1/S2 noted, no murmurs/rubs/gallops noted LUNGS: Lungs are clear to auscultation bilaterally, no apparent distress ABDOMEN: soft, nontender, no rebound or guarding, bowel sounds noted throughout abdomen GU:no cva tenderness NEURO:  Pt is awake/alert/appropriate, moves all extremitiesx4.  No facial droop.   EXTREMITIES: pulses normal/equal, full ROM SKIN: warm, color normal PSYCH: no abnormalities of mood noted, alert and oriented to situation  ED Course  Procedures   DIAGNOSTIC STUDIES: Oxygen Saturation is 98% on RA, normal by my interpretation.     COORDINATION OF CARE: 3:59 AM Discussed treatment plan with pt at bedside and pt agreed to plan.  Workup negative He has had ED visits previously for similar I doubt PE (appears PERC negative) I have advised need for PCP followup F/u info given to patient   Labs Review Labs Reviewed  CBC - Abnormal; Notable for the following:    HCT 38.9 (*)    All other components within normal limits  BASIC METABOLIC PANEL - Abnormal; Notable for the following:    Creatinine, Ser 1.37 (*)    All other components within normal limits  BRAIN NATRIURETIC PEPTIDE  I-STAT TROPOININ, ED    Imaging Review Dg Chest 2 View  12/05/2014   CLINICAL DATA:  Intermittent dyspnea for 1 month  EXAM: CHEST  2 VIEW  COMPARISON:  11/09/2014  FINDINGS: The heart size and mediastinal contours are within normal limits. Both lungs are clear. The visualized skeletal structures are unremarkable.  IMPRESSION: No active cardiopulmonary disease.   Electronically Signed   By: Ellery Plunk M.D.   On: 12/05/2014 02:53     EKG Interpretation   Date/Time:  Monday Dec 05 2014 02:26:25 EDT Ventricular Rate:  54 PR Interval:  162 QRS Duration: 94 QT Interval:  380 QTC Calculation: 360 R Axis:   72 Text Interpretation:  Sinus bradycardia Otherwise normal ECG No  significant change since last tracing Confirmed by Bebe Shaggy  MD, Dorinda Hill  [id number]) on 12/05/2014 3:53:14 AM      MDM   Final diagnoses:  Dyspnea    Nursing notes including past medical history and social history reviewed and considered in documentation ,xrays/imaging reviewed by myself and considered during evaluation Labs/vital reviewed myself and considered during evaluation   I personally performed the services described in this documentation, which was scribed in my presence. The recorded information has been reviewed and is accurate.      Zadie Rhine, MD 12/05/14 [phone]

## 2014-12-08 ENCOUNTER — Encounter (HOSPITAL_COMMUNITY): Payer: Self-pay | Admitting: *Deleted

## 2014-12-08 ENCOUNTER — Emergency Department (HOSPITAL_COMMUNITY)
Admission: EM | Admit: 2014-12-08 | Discharge: 2014-12-08 | Disposition: A | Discharge status: Home / Self Care | Payer: Managed Care, Other (non HMO) | Attending: Emergency Medicine | Admitting: Emergency Medicine

## 2014-12-08 DIAGNOSIS — F419 Anxiety disorder, unspecified: Secondary | ICD-10-CM | POA: Insufficient documentation

## 2014-12-08 DIAGNOSIS — Z87891 Personal history of nicotine dependence: Secondary | ICD-10-CM | POA: Insufficient documentation

## 2014-12-08 DIAGNOSIS — J321 Chronic frontal sinusitis: Secondary | ICD-10-CM | POA: Insufficient documentation

## 2014-12-08 MED ORDER — CETIRIZINE HCL 10 MG PO CAPS: 10.0000 mg | ORAL_CAPSULE | Freq: Every day | ORAL | Status: DC

## 2014-12-08 NOTE — ED Notes (Signed)
Pt states frontal headache he describes as pressure and "tingling that goes across" his forehead and down the sides of his temples.  Denies nausea, but c/o photophobia and states eyes are "jumping".

## 2014-12-08 NOTE — ED Provider Notes (Signed)
CSN: [id number]     Arrival date & time 12/08/14  1724 History   First MD Initiated Contact with Patient 12/08/14 1809     Chief Complaint  Patient presents with  . Headache    tingling     (Consider location/radiation/quality/duration/timing/severity/associated sxs/prior Treatment) Patient is a 29 y.o. male presenting with headaches. The history is provided by the patient. No language interpreter was used.  Headache Pain location:  Frontal Associated symptoms: sinus pressure   Associated symptoms: no cough, no fever, no myalgias, no nausea, no neck stiffness and no vomiting   Associated symptoms comment:  Symptoms of frontal headache in frontal region that is on and off for the past 6 months, recurrent 2 days ago. NO nausea or vomiting. He reports sinus congestion, watery eyes at times. No fever at any time.    Past Medical History  Diagnosis Date  . Anxiety   . Panic attack    Past Surgical History  Procedure Laterality Date  . Ruptured kidney     No family history on file. History  Substance Use Topics  . Smoking status: Former Smoker    Quit date: 10/05/2014  . Smokeless tobacco: Never Used  . Alcohol Use: Yes     Comment: Socially    Review of Systems  Constitutional: Negative for fever and chills.  HENT: Positive for sinus pressure.   Eyes:       Watery eyes intermittently.  Respiratory: Negative.  Negative for cough and shortness of breath.   Cardiovascular: Negative.   Gastrointestinal: Negative.  Negative for nausea and vomiting.  Musculoskeletal: Negative.  Negative for myalgias and neck stiffness.  Skin: Negative.   Neurological: Positive for headaches.      Allergies  Review of patient's allergies indicates no known allergies.  Home Medications   Prior to Admission medications   Medication Sig Start Date End Date Taking? Authorizing Provider  OVER THE COUNTER MEDICATION Take 6 tablets by mouth daily. Medication: Clear muscle   Yes Historical  Provider, MD  LORazepam (ATIVAN) 0.5 MG tablet Take 2 tablets (1 mg total) by mouth every 8 (eight) hours as needed for anxiety. Patient not taking: Reported on 12/08/2014 10/26/14   Elpidio AnisShari Jd Mccaster, PA-C  naproxen (NAPROSYN) 500 MG tablet Take 1 tablet (500 mg total) by mouth 2 (two) times daily as needed for mild pain, moderate pain or headache (TAKE WITH MEALS.). Patient not taking: Reported on 07/01/2014 05/02/14   Mercedes Camprubi-Soms, PA-C   BP 117/60 mmHg  Pulse 65  Temp(Src) 97.8 F (36.6 C) (Oral)  Resp 18  SpO2 100% Physical Exam  Constitutional: He appears well-developed and well-nourished.  HENT:  Head: Normocephalic.  Right Ear: External ear normal.  Left Ear: External ear normal.  Nose: Mucosal edema present. Right sinus exhibits frontal sinus tenderness. Left sinus exhibits frontal sinus tenderness.  Mouth/Throat: Oropharynx is clear and moist.  Neck: Normal range of motion. Neck supple.  Cardiovascular: Normal rate and normal heart sounds.   No murmur heard. Pulmonary/Chest: Effort normal and breath sounds normal. He has no wheezes. He has no rales.  Abdominal: Soft. Bowel sounds are normal. He exhibits no distension. There is no tenderness.  Musculoskeletal: Normal range of motion.  Lymphadenopathy:    He has no cervical adenopathy.  Skin: Skin is warm and dry. No pallor.    ED Course  Procedures (including critical care time) Labs Review Labs Reviewed - No data to display  Imaging Review No results found.   EKG Interpretation  None      MDM   Final diagnoses:  None    1. Sinusitis, chronic  Will recommend Zyrtec, ibuprofen, saline nasal sprays and PCP follow up outside the ED for ongoing management of chronic symptoms.     Elpidio AnisShari Isela Stantz, PA-C 12/08/14 1842  Mancel BaleElliott Wentz, MD 12/09/14 [phone]

## 2014-12-08 NOTE — Discharge Instructions (Signed)
Sinusitis Sinusitis is redness, soreness, and inflammation of the paranasal sinuses. Paranasal sinuses are air pockets within the bones of your face (beneath the eyes, the middle of the forehead, or above the eyes). In healthy paranasal sinuses, mucus is able to drain out, and air is able to circulate through them by way of your nose. However, when your paranasal sinuses are inflamed, mucus and air can become trapped. This can allow bacteria and other germs to grow and cause infection. Sinusitis can develop quickly and last only a short time (acute) or continue over a long period (chronic). Sinusitis that lasts for more than 12 weeks is considered chronic.  CAUSES  Causes of sinusitis include:  Allergies.  Structural abnormalities, such as displacement of the cartilage that separates your nostrils (deviated septum), which can decrease the air flow through your nose and sinuses and affect sinus drainage.  Functional abnormalities, such as when the small hairs (cilia) that line your sinuses and help remove mucus do not work properly or are not present. SIGNS AND SYMPTOMS  Symptoms of acute and chronic sinusitis are the same. The primary symptoms are pain and pressure around the affected sinuses. Other symptoms include:  Upper toothache.  Earache.  Headache.  Bad breath.  Decreased sense of smell and taste.  A cough, which worsens when you are lying flat.  Fatigue.  Fever.  Thick drainage from your nose, which often is green and may contain pus (purulent).  Swelling and warmth over the affected sinuses. DIAGNOSIS  Your health care provider will perform a physical exam. During the exam, your health care provider may:  Look in your nose for signs of abnormal growths in your nostrils (nasal polyps).  Tap over the affected sinus to check for signs of infection.  View the inside of your sinuses (endoscopy) using an imaging device that has a light attached (endoscope). If your health  care provider suspects that you have chronic sinusitis, one or more of the following tests may be recommended:  Allergy tests.  Nasal culture. A sample of mucus is taken from your nose, sent to a lab, and screened for bacteria.  Nasal cytology. A sample of mucus is taken from your nose and examined by your health care provider to determine if your sinusitis is related to an allergy. TREATMENT  Most cases of acute sinusitis are related to a viral infection and will resolve on their own within 10 days. Sometimes medicines are prescribed to help relieve symptoms (pain medicine, decongestants, nasal steroid sprays, or saline sprays).  However, for sinusitis related to a bacterial infection, your health care provider will prescribe antibiotic medicines. These are medicines that will help kill the bacteria causing the infection.  Rarely, sinusitis is caused by a fungal infection. In theses cases, your health care provider will prescribe antifungal medicine. For some cases of chronic sinusitis, surgery is needed. Generally, these are cases in which sinusitis recurs more than 3 times per year, despite other treatments. HOME CARE INSTRUCTIONS   Drink plenty of water. Water helps thin the mucus so your sinuses can drain more easily.  Use a humidifier.  Inhale steam 3 to 4 times a day (for example, sit in the bathroom with the shower running).  Apply a warm, moist washcloth to your face 3 to 4 times a day, or as directed by your health care provider.  Use saline nasal sprays to help moisten and clean your sinuses.  Take medicines only as directed by your health care provider.    If you were prescribed either an antibiotic or antifungal medicine, finish it all even if you start to feel better. SEEK IMMEDIATE MEDICAL CARE IF:  You have increasing pain or severe headaches.  You have nausea, vomiting, or drowsiness.  You have swelling around your face.  You have vision problems.  You have a stiff  neck.  You have difficulty breathing. MAKE SURE YOU:   Understand these instructions.  Will watch your condition.  Will get help right away if you are not doing well or get worse. Document Released: 07/22/2005 Document Revised: 12/06/2013 Document Reviewed: 08/06/2011 [facility] Patient Information 2015 [place], [place]. This information is not intended to replace advice given to you by your health care provider. Make sure you discuss any questions you have with your health care provider. Allergies Allergies may happen from anything your body is sensitive to. This may be food, medicines, pollens, chemicals, and nearly anything around you in everyday life that produces allergens. An allergen is anything that causes an allergy producing substance. Heredity is often a factor in causing these problems. This means you may have some of the same allergies as your parents. Food allergies happen in all age groups. Food allergies are some of the most severe and life threatening. Some common food allergies are cow's milk, seafood, eggs, nuts, wheat, and soybeans. SYMPTOMS   Swelling around the mouth.  An itchy red rash or hives.  Vomiting or diarrhea.  Difficulty breathing. SEVERE ALLERGIC REACTIONS ARE LIFE-THREATENING. This reaction is called anaphylaxis. It can cause the mouth and throat to swell and cause difficulty with breathing and swallowing. In severe reactions only a trace amount of food (for example, peanut oil in a salad) may cause death within seconds. Seasonal allergies occur in all age groups. These are seasonal because they usually occur during the same season every year. They may be a reaction to molds, grass pollens, or tree pollens. Other causes of problems are house dust mite allergens, pet dander, and mold spores. The symptoms often consist of nasal congestion, a runny itchy nose associated with sneezing, and tearing itchy eyes. There is often an associated itching of the mouth and  ears. The problems happen when you come in contact with pollens and other allergens. Allergens are the particles in the air that the body reacts to with an allergic reaction. This causes you to release allergic antibodies. Through a chain of events, these eventually cause you to release histamine into the blood stream. Although it is meant to be protective to the body, it is this release that causes your discomfort. This is why you were given anti-histamines to feel better. If you are unable to pinpoint the offending allergen, it may be determined by skin or blood testing. Allergies cannot be cured but can be controlled with medicine. Hay fever is a collection of all or some of the seasonal allergy problems. It may often be treated with simple over-the-counter medicine such as diphenhydramine. Take medicine as directed. Do not drink alcohol or drive while taking this medicine. Check with your caregiver or package insert for child dosages. If these medicines are not effective, there are many new medicines your caregiver can prescribe. Stronger medicine such as nasal spray, eye drops, and corticosteroids may be used if the first things you try do not work well. Other treatments such as immunotherapy or desensitizing injections can be used if all else fails. Follow up with your caregiver if problems continue. These seasonal allergies are usually not life threatening. They  are generally more of a nuisance that can often be handled using medicine. HOME CARE INSTRUCTIONS   If unsure what causes a reaction, keep a diary of foods eaten and symptoms that follow. Avoid foods that cause reactions.  If hives or rash are present:  Take medicine as directed.  You may use an over-the-counter antihistamine (diphenhydramine) for hives and itching as needed.  Apply cold compresses (cloths) to the skin or take baths in cool water. Avoid hot baths or showers. Heat will make a rash and itching worse.  If you are severely  allergic:  Following a treatment for a severe reaction, hospitalization is often required for closer follow-up.  Wear a medic-alert bracelet or necklace stating the allergy.  You and your family must learn how to give adrenaline or use an anaphylaxis kit.  If you have had a severe reaction, always carry your anaphylaxis kit or EpiPen with you. Use this medicine as directed by your caregiver if a severe reaction is occurring. Failure to do so could have a fatal outcome. SEEK MEDICAL CARE IF:  You suspect a food allergy. Symptoms generally happen within 30 minutes of eating a food.  Your symptoms have not gone away within 2 days or are getting worse.  You develop new symptoms.  You want to retest yourself or your child with a food or drink you think causes an allergic reaction. Never do this if an anaphylactic reaction to that food or drink has happened before. Only do this under the care of a caregiver. SEEK IMMEDIATE MEDICAL CARE IF:   You have difficulty breathing, are wheezing, or have a tight feeling in your chest or throat.  You have a swollen mouth, or you have hives, swelling, or itching all over your body.  You have had a severe reaction that has responded to your anaphylaxis kit or an EpiPen. These reactions may return when the medicine has worn off. These reactions should be considered life threatening. MAKE SURE YOU:   Understand these instructions.  Will watch your condition.  Will get help right away if you are not doing well or get worse. Document Released: 10/15/2002 Document Revised: 11/16/2012 Document Reviewed: 03/21/2008 [facility] Patient Information 2015 [place], [place]. This information is not intended to replace advice given to you by your health care provider. Make sure you discuss any questions you have with your health care provider.  Emergency Department Resource Guide 1) Find a Doctor and Pay Out of Pocket Although you won't have to find out who is  covered by your insurance plan, it is a good idea to ask around and get recommendations. You will then need to call the office and see if the doctor you have chosen will accept you as a new patient and what types of options they offer for patients who are self-pay. Some doctors offer discounts or will set up payment plans for their patients who do not have insurance, but you will need to ask so you aren't surprised when you get to your appointment.  2) Contact Your Local Health Department Not all health departments have doctors that can see patients for sick visits, but many do, so it is worth a call to see if yours does. If you don't know where your local health department is, you can check in your phone book. The CDC also has a tool to help you locate your state's health department, and many state websites also have listings of all of their local health departments.  3) Find  a [facility] If your illness is not likely to be very severe or complicated, you may want to try a walk in clinic. These are popping up all over the country in pharmacies, drugstores, and shopping centers. They're usually staffed by nurse practitioners or physician assistants that have been trained to treat common illnesses and complaints. They're usually fairly quick and inexpensive. However, if you have serious medical issues or chronic medical problems, these are probably not your best option.  No Primary Care Doctor: - Call Health Connect at  [phone] - they can help you locate a primary care doctor that  accepts your insurance, provides certain services, etc. - Physician Referral Service- [phone]  Chronic Pain Problems: Organization         Address  Phone   Notes  [facility]  [phone] Patients need to be referred by their primary care doctor.   Medication Assistance: Organization         Address  Phone   Notes  [facility] Medication [facility] [address] [place], [place] [address] [phone] --Must be a resident of [facility] -- Must have NO insurance coverage whatsoever (no Medicaid/ Medicare, etc.) -- The pt. MUST have a primary care doctor that directs their care regularly and follows them in the community   [facility]ist  [phone]   [employer]  [phone]    Agencies that provide inexpensive medical care: Organization         Address  Phone   Notes  [place]  [phone]   [facility]    [phone]   [facility] [place], [place] [address] [phone]   [place] [address], [place] [phone]   Planned Parenthood    [phone]   [facility]    [phone]   Reserve and [place] [address], [place] Phone:  [phone], Fax:  [phone] Hours of Operation:  9 am - 6 pm, M-F.  Also accepts Medicaid/Medicare and self-pay.  [facility] for [place] [address], [address], [place] Phone: [phone], Fax: [phone]. Hours of Operation:  8:30 am - 5:30 pm, M-F.  Also accepts Medicaid and self-pay.  [facility] [place] [address] [place] Phone: [phone]   [place], [place], [place] [phone], Ext. [phone] Mondays & Thursdays: 7-9 AM.  First 15 patients are seen on a first come, first serve basis.    [place] Providers:  Organization         Address  Phone   Notes  [facility] [address], [place] [phone] Also accepts self-pay patients.  [facility] [address], [place]  [phone]   [place], [address], [place] [phone]   [facility] [address], [place] [phone]   Lucianne Lei [address], [place]   [phone] Only accepts [place] Access [place] patients after they have their name applied to their card.   Self-Pay (no insurance) in [facility]:  Organization         Address  Phone   Notes  Sickle Cell Patients, [facility] [place] [place]  [place], [place] [phone]   [facility] [place] [phone]   [facility] [place]  [place], [place], [place] [phone]   [facility]/Dr. Osei-Bonsu  [address] [place] or [address], [place] [phone] Phone number for both [place] and [place] locations is the same.  Urgent Medical and [facility] [address] [place] [phone]   [facility] [address] [place] or [address] [phone]   [facility] [address], [place] [phone], phone; [phone], fax Sees patients 1st and 3rd Saturday of every month.  Must not qualify for public or private insurance (i.e. Medicaid, Medicare, [place] Health Choice, Veterans' Benefits)  Household income should be no more than 200% of the poverty level The clinic cannot treat you if you are pregnant or think you are pregnant  Sexually transmitted diseases are not treated at the clinic.    Dental Care: Organization         Address  Phone  Notes  [facility] Department of [facility] [place] [phone] Accepts children up to age 50 who are enrolled in [place] or [place]; pregnant women with a Medicaid card; and children who have applied for Medicaid or [place] Health Choice, but were declined, whose parents can pay a reduced fee at time of service.  [facility] Department of [facility]  [address], [place] [phone] Accepts children up to age 59 who are enrolled  in [place] or [place]; pregnant women with a Medicaid card; and children who have applied for Medicaid or [place] Health Choice, but were declined, whose parents can pay a reduced fee at time of service.  [place] Adult Dental Access PROGRAM  [place] [phone] Patients are seen by appointment only. Walk-ins are not accepted. [place] will see patients 49 years of age and older. Monday - Tuesday (8am-5pm) Most Wednesdays (8:30-5pm) $30 per visit, cash only  [facility] Adult Dental Access PROGRAM  [address], [facility] [phone] Patients are seen by appointment only. Walk-ins are not accepted. [place] will see patients 42 years of age and older. One Wednesday Evening (Monthly: Volunteer Based).  $30 per visit, cash only  [place]  [phone] for adults; Children under age 52, call Graduate Pediatric Dentistry at [phone]. Children aged 22-14, please call [phone] to request a pediatric application.  Dental services are provided in all areas of dental care including fillings, crowns and bridges, complete and partial dentures, implants, gum treatment, root canals, and extractions. Preventive care is also provided. Treatment is provided to both adults and children. Patients are selected via a lottery and there is often a waiting list.   [facility] [address] [place]  [phone] [email or web address]   [facility] [address] [place], [place] [phone], Ext. [phone] Second and Fourth Thursday of each month, opens at 6:30 AM; Clinic ends at 9 AM.  Patients are seen on a first-come first-served basis, and a limited number are seen during each clinic.   [facility]  [address] [place]er [place], [place] [phone]   Eligibility Requirements You must have lived in [place], [place], or [place]  counties for at least the last three months.   You cannot be  eligible for state or federal sponsored [employer], including [employer], [place], or [employer].   You generally cannot be eligible for healthcare insurance through your employer.    How to apply: Eligibility screenings are held every Tuesday and Wednesday afternoon from 1:00 pm until 4:00 pm. You do not need an appointment for the interview!  [facility] [address], [place], [place]   [place]  [place] Department  [place]  [phone]    Behavioral Health Resources in the Community: Intensive Outpatient Programs Organization         Address  Phone  Notes  [place]. [address], [place], [place] [phone]   [facility] Outpatient [address] [place], [place]   ADS: Alcohol & Drug Svcs [address] [place], [place]   [place] [address],  [place], [place] or [phone]   Substance Abuse Resources Organization         Address  Phone  Notes  Alcohol and Drug Services  [phone]   [place]  [phone]   [facility] [place]   Chinita Pester  [phone]   Residential & Outpatient Substance Abuse Program  [phone]   Psychological Services Organization         Address  Phone  Notes  [facility]  [place]  [phone]   [place] [address] [place] or [phone]    Mobile Crisis Teams Organization         Address  Phone  Notes  [facility], Mobile Crisis Care Unit  [phone]   [facility]  [address] [place], [place]   [place] [address], [place] [phone]    Self-Help/Support Groups Organization          Address  Phone             Notes  [place]. of [place] - variety of support groups  [place] Call for more information  Narcotics Anonymous (NA), [facility] [address], [employer] [place]  2 meetings at this location   Special educational needs teacher         Address  Phone  Notes  [facility] [place],    [address]  [phone]   [facility]  [address], [place] [address], [place], [place]   [place], [place] [phone] Admissions: 8am-3pm M-F  Incentives Substance [place] [address],    [place], [place] [phone]   [facility] [address] [place], [place], [place]   [facility] [address]  [place], [place]   [facility]ight Programs - Intensive Outpatient [address]een [address], [place], [place]   [facility] [place] [address].,  [place], [place] or [phone]   [facility] (RTS) [address], [place], [place] Accepts Medicaid  [facility] [address],  [place] [phone] Substance Abuse/Addiction Treatment   [facility] Resources Organization         Address  Phone  Notes  Lexicographer Services  (  [phone]   Domenic Schwab, PhD [address] Arlis Porta [place], [place]   [phone] or [phone]   [place], [place] [phone]   [address], [place], [place] [phone] Insurance/Medicaid/sponsorship through [facility] and Families [address], [address] [place]                                    [place], [place] [phone] [place] [address], [place] [phone]    Dr. Adele Schilder  [phone]   [facility] Dept. 1) [address], [place] 2) [place] 3)  [place] [address], [place] [phone]  [phone]   [place] [phone] or [phone] (After Hours)

## 2015-01-06 ENCOUNTER — Emergency Department (HOSPITAL_COMMUNITY): Payer: Managed Care, Other (non HMO)

## 2015-01-06 ENCOUNTER — Encounter (HOSPITAL_COMMUNITY): Payer: Self-pay | Admitting: *Deleted

## 2015-01-06 DIAGNOSIS — Z79899 Other long term (current) drug therapy: Secondary | ICD-10-CM | POA: Insufficient documentation

## 2015-01-06 DIAGNOSIS — F419 Anxiety disorder, unspecified: Secondary | ICD-10-CM | POA: Insufficient documentation

## 2015-01-06 DIAGNOSIS — R1013 Epigastric pain: Secondary | ICD-10-CM | POA: Insufficient documentation

## 2015-01-06 DIAGNOSIS — R0602 Shortness of breath: Secondary | ICD-10-CM | POA: Insufficient documentation

## 2015-01-06 DIAGNOSIS — Z87891 Personal history of nicotine dependence: Secondary | ICD-10-CM | POA: Insufficient documentation

## 2015-01-06 DIAGNOSIS — R072 Precordial pain: Secondary | ICD-10-CM | POA: Insufficient documentation

## 2015-01-06 DIAGNOSIS — R05 Cough: Secondary | ICD-10-CM | POA: Insufficient documentation

## 2015-01-06 DIAGNOSIS — R55 Syncope and collapse: Secondary | ICD-10-CM | POA: Insufficient documentation

## 2015-01-06 NOTE — ED Notes (Signed)
He was working in the heat when it started he feels weak since

## 2015-01-06 NOTE — ED Notes (Signed)
The pt is c/o mid to lt upper chest pain while working tiday.  The pain is worse with movement espedially with lt sided movement. With some sob  Hx of panic attacks

## 2015-01-07 ENCOUNTER — Emergency Department (HOSPITAL_COMMUNITY)
Admission: EM | Admit: 2015-01-07 | Discharge: 2015-01-07 | Disposition: A | Discharge status: Home / Self Care | Payer: Managed Care, Other (non HMO) | Attending: Emergency Medicine | Admitting: Emergency Medicine

## 2015-01-07 DIAGNOSIS — R55 Syncope and collapse: Secondary | ICD-10-CM

## 2015-01-07 DIAGNOSIS — R079 Chest pain, unspecified: Secondary | ICD-10-CM

## 2015-01-07 NOTE — Discharge Instructions (Signed)

## 2015-01-07 NOTE — ED Provider Notes (Signed)
CSN: [id number]     Arrival date & time 01/06/15  2222 History  This chart was scribed for Zadie Rhineonald Olia Hinderliter, MD by Abel PrestoKara Demonbreun, ED Scribe. This patient was seen in room A10C/A10C and the patient's care was started at 1:50 AM.    Chief Complaint  Patient presents with  . Chest Pain    Patient is a 29 y.o. male presenting with chest pain. The history is provided by the patient. No language interpreter was used.  Chest Pain Pain location:  Substernal area Pain radiates to:  Epigastrium Pain radiates to the back: no   Onset quality:  Sudden Duration:  1 day Timing:  Constant Progression:  Waxing and waning Chronicity:  New Context: movement   Relieved by:  None tried Worsened by:  Nothing tried Ineffective treatments:  None tried Associated symptoms: abdominal pain, cough and shortness of breath   Associated symptoms: no fever, no lower extremity edema, no syncope and not vomiting   Abdominal pain:    Location:  Epigastric   Quality:  Sharp   Severity:  Moderate   Onset quality:  Sudden   Duration:  1 day   Timing:  Intermittent   Progression:  Unchanged   Chronicity:  New Risk factors: smoking   Risk factors: no prior DVT/PE     HPI Comments: Todd Riggs is a 29 y.o. male with PMHx of anxiety and panic attacks who presents to the Emergency Department complaining of waxing and waning upper chest pain with onset today. Pt was at working in a hot paint room at onset. Pt notes associated episode of mild SOB and lightheadedness. He reports movement of his arms aggravates the pain. Pt also reports sharp epigastric abdominal pain and mild dry cough. Pt states he has been feeling "uneasy" for 3 days. Pt is a social smoker. He denies h/o DVT/PE. Pt denies prolonged travel or recent surgeries. He denies LOC, fever, vomiting, hemoptysis, and pain or swelling in bilateral LEs.    Past Medical History  Diagnosis Date  . Anxiety   . Panic attack    Past Surgical History  Procedure  Laterality Date  . Ruptured kidney     No family history on file. History  Substance Use Topics  . Smoking status: Former Smoker    Quit date: 10/05/2014  . Smokeless tobacco: Never Used  . Alcohol Use: Yes     Comment: Socially    Review of Systems  Constitutional: Negative for fever.  Respiratory: Positive for cough and shortness of breath.   Cardiovascular: Positive for chest pain. Negative for leg swelling and syncope.  Gastrointestinal: Positive for abdominal pain. Negative for vomiting.  Neurological: Positive for light-headedness. Negative for syncope.  All other systems reviewed and are negative.     Allergies  Review of patient's allergies indicates no known allergies.  Home Medications   Prior to Admission medications   Medication Sig Start Date End Date Taking? Authorizing Provider  Cetirizine HCl (ZYRTEC ALLERGY) 10 MG CAPS Take 1 capsule (10 mg total) by mouth daily. 12/08/14   Elpidio AnisShari Upstill, PA-C  LORazepam (ATIVAN) 0.5 MG tablet Take 2 tablets (1 mg total) by mouth every 8 (eight) hours as needed for anxiety. Patient not taking: Reported on 12/08/2014 10/26/14   Elpidio AnisShari Upstill, PA-C  naproxen (NAPROSYN) 500 MG tablet Take 1 tablet (500 mg total) by mouth 2 (two) times daily as needed for mild pain, moderate pain or headache (TAKE WITH MEALS.). Patient not taking: Reported on 07/01/2014 05/02/14  Mercedes Camprubi-Soms, PA-C  OVER THE COUNTER MEDICATION Take 6 tablets by mouth daily. Medication: Clear muscle    Historical Provider, MD   BP 107/63 mmHg  Pulse 60  Temp(Src) 98.4 F (36.9 C)  Resp 16  Ht  (1.88 m)  Wt 215 lb 2 oz (97.58 kg)  BMI 27.61 kg/m2  SpO2 98% Physical Exam  Nursing note and vitals reviewed. CONSTITUTIONAL: Well developed/well nourished HEAD: Normocephalic/atraumatic EYES: EOMI/PERRL ENMT: Mucous membranes moist NECK: supple no meningeal signs SPINE/BACK:entire spine nontender CV: S1/S2 noted, no murmurs/rubs/gallops  noted CHEST: mild chest wall tenderness LUNGS: Lungs are clear to auscultation bilaterally, no apparent distress ABDOMEN: soft, nontender, no rebound or guarding, bowel sounds noted throughout abdomen GU:no cva tenderness NEURO: Pt is awake/alert/appropriate, moves all extremitiesx4.  No facial droop.   EXTREMITIES: pulses normal/equal, full ROM; no edema or calf tenderness SKIN: warm, color normal PSYCH: no abnormalities of mood noted, alert and oriented to situation   ED Course  Procedures (including critical care time) DIAGNOSTIC STUDIES: Oxygen Saturation is 98% on room air, normal by my interpretation.    COORDINATION OF CARE: 1:57 AM Discussed treatment plan with patient at beside, the patient agrees with the plan and has no further questions at this time.   Pt well appearing no distress, he feels at baseline, stable for d/c home Low suspicion for ACS/PE/Dissection given history/exam  Imaging Review Dg Chest 2 View  01/06/2015   CLINICAL DATA:  Acute onset of mid to left upper chest pain. Shortness of breath. Initial encounter.  EXAM: CHEST  2 VIEW  COMPARISON:  Chest radiograph performed 12/05/2014  FINDINGS: The lungs are well-aerated and clear. There is no evidence of focal opacification, pleural effusion or pneumothorax.  The heart is normal in size; the mediastinal contour is within normal limits. No acute osseous abnormalities are seen.  IMPRESSION: No acute cardiopulmonary process seen.   Electronically Signed   By: Roanna Raider M.D.   On: 01/06/2015 23:51     EKG Interpretation   Date/Time:  Friday January 06 2015 22:41:36 EDT Ventricular Rate:  51 PR Interval:  156 QRS Duration: 102 QT Interval:  400 QTC Calculation: 368 R Axis:   63 Text Interpretation:  Sinus bradycardia Incomplete right bundle branch  block Borderline ECG No significant change was found Confirmed by CAMPOS   MD, Caryn Bee ([id number]) on 01/06/2015 11:55:11 PM      MDM   Final diagnoses:  Chest pain,  unspecified chest pain type  Near syncope    Nursing notes including past medical history and social history reviewed and considered in documentation xrays/imaging reviewed by myself and considered during evaluation  I personally performed the services described in this documentation, which was scribed in my presence. The recorded information has been reviewed and is accurate.        Zadie Rhine, MD 01/07/15 [phone]

## 2015-01-07 NOTE — ED Notes (Signed)
Pt c/o left sided chest tightness worsening with movement. Reports pain started while at work today. Pain also worsens with inspiration. Hx of a panic attack in January; states this does not feel like anxiety

## 2015-01-23 ENCOUNTER — Emergency Department (HOSPITAL_COMMUNITY)
Admission: EM | Admit: 2015-01-23 | Discharge: 2015-01-24 | Disposition: A | Discharge status: Home / Self Care | Payer: Managed Care, Other (non HMO) | Attending: Emergency Medicine | Admitting: Emergency Medicine

## 2015-01-23 ENCOUNTER — Encounter (HOSPITAL_COMMUNITY): Payer: Self-pay

## 2015-01-23 DIAGNOSIS — R0602 Shortness of breath: Secondary | ICD-10-CM

## 2015-01-23 DIAGNOSIS — Z87891 Personal history of nicotine dependence: Secondary | ICD-10-CM | POA: Insufficient documentation

## 2015-01-23 DIAGNOSIS — M25512 Pain in left shoulder: Secondary | ICD-10-CM

## 2015-01-23 DIAGNOSIS — B359 Dermatophytosis, unspecified: Secondary | ICD-10-CM

## 2015-01-23 DIAGNOSIS — F410 Panic disorder [episodic paroxysmal anxiety] without agoraphobia: Secondary | ICD-10-CM | POA: Insufficient documentation

## 2015-01-23 NOTE — ED Notes (Signed)
Pt complains of being short of breath all weekend. He also states he's had to pop his left shoulder back into place twice this weekend

## 2015-01-24 MED ORDER — IBUPROFEN 800 MG PO TABS: 800.0000 mg | ORAL_TABLET | Freq: Three times a day (TID) | ORAL | Status: DC

## 2015-01-24 MED ORDER — CLOTRIMAZOLE-BETAMETHASONE 1-0.05 % EX CREA: TOPICAL_CREAM | CUTANEOUS | Status: DC

## 2015-01-24 NOTE — Discharge Instructions (Signed)
°Emergency Department Resource Guide °1) Find a Doctor and Pay Out of Pocket °Although you won't have to find out who is covered by your insurance plan, it is a good idea to ask around and get recommendations. You will then need to call the office and see if the doctor you have chosen will accept you as a new patient and what types of options they offer for patients who are self-pay. Some doctors offer discounts or will set up payment plans for their patients who do not have insurance, but you will need to ask so you aren't surprised when you get to your appointment. ° °2) Contact Your Local Health Department °Not all health departments have doctors that can see patients for sick visits, but many do, so it is worth a call to see if yours does. If you don't know where your local health department is, you can check in your phone book. The CDC also has a tool to help you locate your state's health department, and many state websites also have listings of all of their local health departments. ° °3) Find a Walk-in Clinic °If your illness is not likely to be very severe or complicated, you may want to try a walk in clinic. These are popping up all over the country in pharmacies, drugstores, and shopping centers. They're usually staffed by nurse practitioners or physician assistants that have been trained to treat common illnesses and complaints. They're usually fairly quick and inexpensive. However, if you have serious medical issues or chronic medical problems, these are probably not your best option. ° °No Primary Care Doctor: °- Call [facility] at  832-8000 - they can help you locate a primary care doctor that  accepts your insurance, provides certain services, etc. °- Physician Referral Service- 1-800-533-3463 ° °Chronic Pain Problems: °Organization         Address  Phone   Notes  °[facility]  (336) 297-2271 Patients need to be referred by their primary care doctor.  ° °Medication  Assistance: °Organization         Address  Phone   Notes  °[place] [facility] [address] °[place], [place] [address] (336) 641-8030 --Must be a resident of [place] °-- Must have NO insurance coverage whatsoever (no Medicaid/ Medicare, etc.) °-- The pt. MUST have a primary care doctor that directs their care regularly and follows them in the community °  °[facility]  (866) 331-1348   °[employer]  (888) 892-1162   ° °Agencies that provide inexpensive medical care: °Organization         Address  Phone   Notes  °[facility]  (336) 832-8035   °[facility]    (336) 832-7272   °[facility] [address] °[place], [place] [address] (336) 832-4777   °[facility] [address], °[place] (336) 271-4999   °[facility]    (336) 373-0678   °[facility]    (336) 272-1050   °[facility] ° [address], [place] Phone:  (336) 832-4444, Fax:  (336) 832-4440 Hours of Operation:  9 am - 6 pm, M-F.  Also accepts Medicaid/Medicare and self-pay.  °[facility] ° [address], [place] Phone: (336) 832-3150, Fax: (336) 832-3151. Hours of Operation:  8:30 am - 5:30 pm, M-F.  Also accepts Medicaid and self-pay.  °[facility] [place] [address], [place] Phone: (336) 878-6027   °[facility] [address], [place], [place] (336)723-1848, Ext. 123 Mondays & Thursdays: 7-9 AM.  First 15 patients are seen on a first come, first serve basis. °  ° °Medicaid-accepting [place] Providers: ° °Organization         Address  Phone   Notes  °[facility] [address], [place] (336) 641-2100 Also accepts self-pay patients.  °[facility] [address], [place] ° (336) 856-9996   °[facility] [address], [place]  (336) 288-8857   °[facility] [address] [place] [address], [place] (336) 299-7000   °Veita Bland [address], [place]  ° (336) 373-1557 Only accepts [place] Access Medicaid patients after they have their name applied to their card.  ° °Self-Pay (no insurance) in [place]: ° °Organization         Address  Phone   Notes  °Sickle Cell Patients, [facility] [address], [place] (336) 832-1970   °[facility] [address], [place] (336) 832-4400   °[facility] ° [address], [place] (336) 992-4800   °[facility]/Dr. Osei-Bonsu ° [address] [place] [address], [place] or [address], [place] (336) 841-8500 Phone number for both [place] and [place] locations is the same.  °[facility] [address], [place] (336) 299-0000   °[facility] [address] [place] [address], [place] or [address] (336) 852-7530 °(336) 878-2260   °[facility] [address], [place] (336) 350-1642, phone; (336) 294-5005, fax Sees patients 1st and 3rd Saturday of every month.  Must not qualify for public or private insurance (i.e. Medicaid, Medicare, [place] Health Choice, Veterans' Benefits) • Household income should be no more than 200% of the poverty level •The clinic cannot treat you if you are pregnant or think you are pregnant • Sexually transmitted diseases are not treated at the clinic.  ° ° °Dental Care: °Organization         Address  Phone  Notes  °[place] [facility] [address], [place] (336) 641-6152 Accepts children up to age 21 who are enrolled in Medicaid or [place] Health Choice; pregnant women with a Medicaid card; and children who have applied for Medicaid or [place] Health Choice, but were declined, whose parents can pay a reduced fee at time of service.  °[place]  [facility] [place]  [address], [place] (336) 641-7733 Accepts children up to age 21 who are enrolled in Medicaid or [place] Health Choice; pregnant women with a Medicaid card; and children who have applied for Medicaid or  Health Choice, but were declined, whose parents can pay a reduced fee at time of service.  °[facility] ° [address], [place] (336) 641-4533 Patients are seen by appointment only. Walk-ins are not accepted. [facility] will see patients 18 years of age and older. °Monday - Tuesday (8am-5pm) °Most Wednesdays (8:30-5pm) °$30 per visit, cash only  °[facility] ° [address], [place] (336) 641-4533 Patients are seen by appointment only. Walk-ins are not accepted. [facility] will see patients 18 years of age and older. °One   Wednesday Evening (Monthly: Volunteer Based).  $30 per visit, cash only  °[facility]  (919) 537-3737 for adults; Children under age 4, call Graduate Pediatric Dentistry at (919) [phone]. Children aged 4-14, please call (919) 537-3737 to request a pediatric application. ° Dental services are provided in all areas of dental care including fillings, crowns and bridges, complete and partial dentures, implants, gum treatment, root canals, and extractions. Preventive care is also provided. Treatment is provided to both adults and children. °Patients are selected via a lottery and there is often a waiting list. °  °[facility] [address], °[place] ° (336) 763-8833 [email or web address] °  °[facility] [address], [place], [place] (336)723-1848, Ext. 123 Second and Fourth Thursday of each month, opens at 6:30 AM; Clinic ends at 9 AM.  Patients are seen on a first-come first-served basis, and a limited number are seen during each clinic.  ° °[facility] ° [address], [place], [place] (336) 723-7904    Eligibility Requirements °You must have lived in [place], [place], or [place] counties for at least the last three months. °  You cannot be eligible for state or federal sponsored healthcare insurance, including Veterans Administration, Medicaid, or Medicare. °  You generally cannot be eligible for healthcare insurance through your employer.  °  How to apply: °Eligibility screenings are held every Tuesday and Wednesday afternoon from 1:00 pm until 4:00 pm. You do not need an appointment for the interview!  °[facility] [address], [place], [place] 336-631-2330   °[facility]  336-342-8273   °[facility]  336-703-3100   °[facility]  336-570-6415   ° °Behavioral Health Resources in the Community: °Intensive Outpatient Programs °Organization         Address  Phone  Notes  °[facility] [address], [place], [place] 336-878-6098   °[facility] [address], [place], [place] 336-832-9800   °[facility] [address], [place], [place] ° 336-882-2125   °[facility] [address],  °[place], [place] 1-800-853-5163 or 336-641-4981   °Substance Abuse Resources °Organization         Address  Phone  Notes  °[facility]  336-882-2125   °[facility]  336-784-9470   °[facility] [place] [facility]  336-285-9073   °[facility]  336-845-3988   °Residential & Outpatient Substance Abuse Program  1-800-659-3381   °Psychological Services °Organization         Address  Phone  Notes  °[facility]  336- 832-9600   °[facility]  336- 378-7881   °[facility] [address], [place] 1-800-853-5163 or 336-641-4981   ° °Mobile Crisis Teams °Organization         Address  Phone  Notes  °[facility], Mobile Crisis Care Unit  1-877-626-1772   °Assertive °[facility] ° [address]  [place], [place] 336-834-9664   °Sharon DeEsch [address] °[place] 336-554-5454   ° °Self-Help/Support Groups °Organization         Address  Phone             Notes  °[facility]  - variety of support groups  336- 373-1402 Call for more information  °Narcotics Anonymous (NA), [facility] [address], °[place]  2 meetings at this location  ° °  Residential Treatment Programs °Organization         Address  Phone  Notes  °[facility] [address],    °[place]  1-866-801-8205   °[facility] ° [address], [place], [place] 704-293-8524   °[facility] [address], [place] 336-845-3988 Admissions: 8am-3pm M-F  °[facility] [address]    °[place], [place] 336-841-1104   °[facility] [address], [place], [place] 336-379-7146   °[facility] [place] [facility] [address]  °[place], [place] 336-285-9073   °[facility] Intensive Outpatient [address], [place], [place] 336-852-3033   °[facility] [address]  °[place], [place] 1-877-615-2722 or 336-784-9470   °[facility] [address] [place], [place] 336-227-7417 Accepts Medicaid  °[facility] [address]  °[place] 1-800-659-3381 Substance Abuse/Addiction Treatment  ° °[place] Behavioral Health Resources °Organization         Address  Phone  Notes  °[facility]  (888) 581-9988   °Julie Brannon, PhD [address] [place], [place]   (336) 349-5553 or (336) 951-0000   °[facility]   [address] °[place], [place] (336) 349-4454   °[facility] [address], [place], [place] (336) 342-8316 Insurance/Medicaid/sponsorship through [facility]  °[facility] [address]                                    [place],  (336) 342-8316 Therapy/tele-psych/case    °[facility] [address]  ° [place],  (336) 349-2233    °Dr. Arfeen  (336) 349-4544   °[facility] [place]  [employer] [place] [facility] 1) [address], [place] °2) [address], [place] °3)  [address], [place] (336) 349-3220 °(336) 342-7768 ° °(336) 342-8140   °[place] [facility] (336) 342-1394 or (336) 342-3537 (After Hours)    ° ° °

## 2015-01-24 NOTE — ED Provider Notes (Signed)
CSN: [id number]     Arrival date & time 01/23/15  2346 History   First MD Initiated Contact with Patient 01/24/15 0143     Chief Complaint  Patient presents with  . Shortness of Breath     (Consider location/radiation/quality/duration/timing/severity/associated sxs/prior Treatment) Patient is a 29 y.o. male presenting with shortness of breath. The history is provided by the patient. No language interpreter was used.  Shortness of Breath Severity:  Moderate Associated symptoms: rash   Associated symptoms: no abdominal pain, no cough, no fever, no neck pain, no vomiting and no wheezing   Associated symptoms comment:  The patient presents with multiple complaints: SOB that is intermittent, usually sudden in onset and last a few minutes. He feels it is the same as 'anxiety attacks' he has had in the past. No current symptoms. Rash to the right side of the neck - ongoing and unresponsive to topical steroids. It does not itch or hurt.  Left shoulder soreness. He reports is dislocates frequently and he pops it back it. Currently does not feel it is dislocated.    Past Medical History  Diagnosis Date  . Anxiety   . Panic attack    Past Surgical History  Procedure Laterality Date  . Ruptured kidney     History reviewed. No pertinent family history. History  Substance Use Topics  . Smoking status: Former Smoker    Quit date: 10/05/2014  . Smokeless tobacco: Never Used  . Alcohol Use: Yes     Comment: Socially    Review of Systems  Constitutional: Negative for fever and chills.  HENT: Negative.   Respiratory: Positive for shortness of breath. Negative for cough and wheezing.   Cardiovascular: Negative.   Gastrointestinal: Negative.  Negative for nausea, vomiting and abdominal pain.  Musculoskeletal: Negative for neck pain.       See HPI.  Skin: Positive for rash.  Neurological: Negative.       Allergies  Review of patient's allergies indicates no known allergies.  Home  Medications   Prior to Admission medications   Medication Sig Start Date End Date Taking? Authorizing Provider  Cetirizine HCl (ZYRTEC ALLERGY) 10 MG CAPS Take 1 capsule (10 mg total) by mouth daily. Patient not taking: Reported on 01/24/2015 12/08/14   Elpidio Anis, PA-C  LORazepam (ATIVAN) 0.5 MG tablet Take 2 tablets (1 mg total) by mouth every 8 (eight) hours as needed for anxiety. Patient not taking: Reported on 12/08/2014 10/26/14   Elpidio Anis, PA-C  naproxen (NAPROSYN) 500 MG tablet Take 1 tablet (500 mg total) by mouth 2 (two) times daily as needed for mild pain, moderate pain or headache (TAKE WITH MEALS.). Patient not taking: Reported on 07/01/2014 05/02/14   Mercedes Camprubi-Soms, PA-C  OVER THE COUNTER MEDICATION Take 6 tablets by mouth daily. Medication: Clear muscle    Historical Provider, MD   BP 120/62 mmHg  Pulse 63  Temp(Src) 98.2 F (36.8 C) (Oral)  Resp 16  SpO2 100% Physical Exam  Constitutional: He is oriented to person, place, and time. He appears well-developed and well-nourished.  HENT:  Head: Normocephalic.  Neck: Normal range of motion. Neck supple.  Cardiovascular: Normal rate, regular rhythm and intact distal pulses.   Pulmonary/Chest: Effort normal and breath sounds normal. He has no wheezes. He has no rales. He exhibits no tenderness.  Abdominal: Soft. Bowel sounds are normal. There is no tenderness. There is no rebound and no guarding.  Musculoskeletal: Normal range of motion.  Left shoulder is not  swollen and does not appear dislocated. FROM with full strength. Tender anteriorly.   Neurological: He is alert and oriented to person, place, and time.  Skin: Skin is warm and dry. No rash noted.  Small area of scaling, hyperpigmented nonraised rash to right neck c/w tinea.   Psychiatric: He has a normal mood and affect.    ED Course  Procedures (including critical care time) Labs Review Labs Reviewed - No data to display  Imaging Review No results  found.   EKG Interpretation None      MDM   Final diagnoses:  None    1. SOB, resolved 2. Tinea 3. Left shoulder pain  Discussed the importance of having a primary care provider that could help with routine needs. 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 SOB indeterminate origin but doubt acute process tonight.     Elpidio Anis, PA-C 01/24/15 0211  Paula Libra, MD 01/24/15 [phone]

## 2015-01-31 ENCOUNTER — Emergency Department (HOSPITAL_COMMUNITY)
Admission: EM | Admit: 2015-01-31 | Discharge: 2015-02-01 | Disposition: A | Discharge status: Home / Self Care | Payer: Managed Care, Other (non HMO) | Attending: Emergency Medicine | Admitting: Emergency Medicine

## 2015-01-31 ENCOUNTER — Encounter (HOSPITAL_COMMUNITY): Payer: Self-pay | Admitting: *Deleted

## 2015-01-31 DIAGNOSIS — Z720 Tobacco use: Secondary | ICD-10-CM | POA: Insufficient documentation

## 2015-01-31 DIAGNOSIS — Z79899 Other long term (current) drug therapy: Secondary | ICD-10-CM | POA: Insufficient documentation

## 2015-01-31 DIAGNOSIS — R51 Headache: Secondary | ICD-10-CM | POA: Insufficient documentation

## 2015-01-31 DIAGNOSIS — R519 Headache, unspecified: Secondary | ICD-10-CM

## 2015-01-31 DIAGNOSIS — F410 Panic disorder [episodic paroxysmal anxiety] without agoraphobia: Secondary | ICD-10-CM | POA: Insufficient documentation

## 2015-01-31 DIAGNOSIS — Z791 Long term (current) use of non-steroidal anti-inflammatories (NSAID): Secondary | ICD-10-CM | POA: Insufficient documentation

## 2015-01-31 NOTE — ED Notes (Signed)
Pt states that he began having a headache this am; pt states that he has been sensitive to light and pain is worse with movement; pt states that he has been laying down all down and took tylenol and motrin without relief; pt denies visual changes

## 2015-02-01 MED ORDER — KETOROLAC TROMETHAMINE 30 MG/ML IJ SOLN
30.0000 mg | Freq: Once | INTRAMUSCULAR | Status: AC
  Administered 2015-02-01: 30 mg via INTRAMUSCULAR
  Filled 2015-02-01: qty 1

## 2015-02-01 NOTE — ED Notes (Signed)
Patient denies pain and is resting comfortably.  

## 2015-02-01 NOTE — ED Provider Notes (Signed)
CSN: [id number]     Arrival date & time 01/31/15  2133 History   First MD Initiated Contact with Patient 02/01/15 0205     Chief Complaint  Patient presents with  . Headache     (Consider location/radiation/quality/duration/timing/severity/associated sxs/prior Treatment) HPI Comments: Patient states he started with a global headache yesterday morning.  He is taking Tylenol with relief only to have the headache return 3.  Denies any nausea, vomiting, head trauma, history of previous headaches, no sinus pressure rhinitis, recent illnesses, no neck pain, no photophobia  Patient is a 29 y.o. male presenting with headaches. The history is provided by the patient.  Headache Pain location:  Generalized Quality:  Unable to specify Radiates to:  Does not radiate Severity currently:  4/10 Severity at highest:  8/10 Onset quality:  Gradual Duration:  1 day Timing:  Constant Progression:  Unchanged Chronicity:  New Similar to prior headaches: no   Context: not activity, not exposure to bright light, not caffeine, not coughing, not eating, not intercourse and not loud noise   Relieved by:  Nothing Worsened by:  Nothing Ineffective treatments:  Acetaminophen Associated symptoms: no abdominal pain, no back pain, no congestion, no diarrhea, no dizziness, no eye pain, no facial pain, no fever, no myalgias, no nausea, no photophobia, no sinus pressure, no syncope and no weakness     Past Medical History  Diagnosis Date  . Anxiety   . Panic attack    Past Surgical History  Procedure Laterality Date  . Ruptured kidney     No family history on file. History  Substance Use Topics  . Smoking status: Current Some Day Smoker    Last Attempt to Quit: 10/05/2014  . Smokeless tobacco: Never Used  . Alcohol Use: Yes     Comment: Socially    Review of Systems  Constitutional: Negative for fever and chills.  HENT: Negative for congestion and sinus pressure.   Eyes: Negative for photophobia,  pain and visual disturbance.  Cardiovascular: Negative for chest pain and syncope.  Gastrointestinal: Negative for nausea, abdominal pain and diarrhea.  Genitourinary: Negative for dysuria.  Musculoskeletal: Negative for myalgias and back pain.  Skin: Negative for rash and wound.  Neurological: Positive for headaches. Negative for dizziness and weakness.  All other systems reviewed and are negative.     Allergies  Lactose intolerance (gi)  Home Medications   Prior to Admission medications   Medication Sig Start Date End Date Taking? Authorizing Provider  clotrimazole-betamethasone (LOTRISONE) cream Apply to affected area 2 times daily prn 01/24/15  Yes Elpidio AnisShari Upstill, PA-C  Multiple Vitamin (MULTIVITAMIN WITH MINERALS) TABS tablet Take 1 tablet by mouth daily.   Yes Historical Provider, MD  OVER THE COUNTER MEDICATION Take 2 tablets by mouth 3 (three) times daily. Medication: Clear muscle   Yes Historical Provider, MD  Cetirizine HCl (ZYRTEC ALLERGY) 10 MG CAPS Take 1 capsule (10 mg total) by mouth daily. Patient not taking: Reported on 01/24/2015 12/08/14   Elpidio AnisShari Upstill, PA-C  ibuprofen (ADVIL,MOTRIN) 800 MG tablet Take 1 tablet (800 mg total) by mouth 3 (three) times daily. 01/24/15   Elpidio AnisShari Upstill, PA-C  LORazepam (ATIVAN) 0.5 MG tablet Take 2 tablets (1 mg total) by mouth every 8 (eight) hours as needed for anxiety. Patient not taking: Reported on 12/08/2014 10/26/14   Elpidio AnisShari Upstill, PA-C  naproxen (NAPROSYN) 500 MG tablet Take 1 tablet (500 mg total) by mouth 2 (two) times daily as needed for mild pain, moderate pain or headache (  TAKE WITH MEALS.). Patient not taking: Reported on 07/01/2014 05/02/14   Mercedes Camprubi-Soms, PA-C   BP 111/59 mmHg  Pulse 53  Temp(Src) 98.1 F (36.7 C) (Oral)  Resp 14  Ht  (1.88 m)  Wt 216 lb (97.977 kg)  BMI 27.72 kg/m2  SpO2 98% Physical Exam  Constitutional: He appears well-developed and well-nourished.  HENT:  Head: Normocephalic and  atraumatic.  Right Ear: External ear normal.  Left Ear: External ear normal.  Eyes: Pupils are equal, round, and reactive to light.  Neck: Normal range of motion.  Cardiovascular: Normal rate and regular rhythm.   Pulmonary/Chest: Effort normal and breath sounds normal.  Abdominal: Soft.  Musculoskeletal: Normal range of motion.  Neurological: He is alert.  Skin: Skin is warm and dry. No rash noted. No erythema.  Nursing note and vitals reviewed.   ED Course  Procedures (including critical care time) Labs Review Labs Reviewed - No data to display  Imaging Review No results found.   EKG Interpretation None     After IM Toradol.  Patient's headache is totally resolved.  He was able to sleep for a short period of  Time, wake headache free MDM   Final diagnoses:  Nonintractable headache, unspecified chronicity pattern, unspecified headache type         Earley Favor, NP 02/01/15 [id number]  Loren Racer, MD 02/01/15 [phone]

## 2015-02-01 NOTE — ED Notes (Signed)
Pt states that his pain has decreased and that he can tolerate looking at "light" at this moment.

## 2015-08-17 ENCOUNTER — Encounter (HOSPITAL_COMMUNITY): Payer: Self-pay | Admitting: Emergency Medicine

## 2015-08-17 ENCOUNTER — Emergency Department (HOSPITAL_COMMUNITY)
Admission: EM | Admit: 2015-08-17 | Discharge: 2015-08-18 | Disposition: A | Discharge status: Home / Self Care | Payer: BLUE CROSS/BLUE SHIELD | Attending: Emergency Medicine | Admitting: Emergency Medicine

## 2015-08-17 ENCOUNTER — Emergency Department (HOSPITAL_COMMUNITY): Payer: BLUE CROSS/BLUE SHIELD

## 2015-08-17 DIAGNOSIS — R001 Bradycardia, unspecified: Secondary | ICD-10-CM | POA: Diagnosis not present

## 2015-08-17 DIAGNOSIS — R7989 Other specified abnormal findings of blood chemistry: Secondary | ICD-10-CM

## 2015-08-17 DIAGNOSIS — F410 Panic disorder [episodic paroxysmal anxiety] without agoraphobia: Secondary | ICD-10-CM | POA: Diagnosis not present

## 2015-08-17 DIAGNOSIS — R0602 Shortness of breath: Secondary | ICD-10-CM | POA: Insufficient documentation

## 2015-08-17 DIAGNOSIS — R079 Chest pain, unspecified: Secondary | ICD-10-CM | POA: Diagnosis present

## 2015-08-17 DIAGNOSIS — R799 Abnormal finding of blood chemistry, unspecified: Secondary | ICD-10-CM

## 2015-08-17 DIAGNOSIS — R202 Paresthesia of skin: Secondary | ICD-10-CM | POA: Diagnosis not present

## 2015-08-17 DIAGNOSIS — F17200 Nicotine dependence, unspecified, uncomplicated: Secondary | ICD-10-CM | POA: Diagnosis not present

## 2015-08-17 LAB — BASIC METABOLIC PANEL
Anion gap: 8 (ref 5–15)
BUN: 22 mg/dL — AB (ref 6–20)
CO2: 27 mmol/L (ref 22–32)
CREATININE: 1.46 mg/dL — AB (ref 0.61–1.24)
Calcium: 9.6 mg/dL (ref 8.9–10.3)
Chloride: 105 mmol/L (ref 101–111)
GFR calc Af Amer: >60 mL/min (ref >60)
Glucose, Bld: 108 mg/dL — ABNORMAL HIGH (ref 65–99)
POTASSIUM: 4.1 mmol/L (ref 3.5–5.1)
Sodium: 140 mmol/L (ref 135–145)

## 2015-08-17 LAB — CBC
HCT: 38.7 % — ABNORMAL LOW (ref 39.0–52.0)
Hemoglobin: 13.4 g/dL (ref 13.0–17.0)
MCH: 28.8 pg (ref 26.0–34.0)
MCHC: 34.6 g/dL (ref 30.0–36.0)
MCV: 83 fL (ref 78.0–100.0)
PLATELETS: 207 10*3/uL (ref 150–400)
RBC: 4.66 MIL/uL (ref 4.22–5.81)
RDW: 13 % (ref 11.5–15.5)
WBC: 4.4 10*3/uL (ref 4.0–10.5)

## 2015-08-17 LAB — I-STAT TROPONIN, ED: Troponin i, poc: 0 ng/mL (ref 0.00–0.08)

## 2015-08-17 NOTE — ED Notes (Signed)
Patient presents with left sided CP radiating to right chest x2-3 days. Also c/o SOB, lightheadedness and intermittent numbness in bilateral hands. Denies N/V, diaphoresis.

## 2015-08-18 NOTE — ED Provider Notes (Signed)
CSN: [id number]     Arrival date & time 08/17/15  2206 History   First MD Initiated Contact with Patient 08/18/15 0402     Chief Complaint  Patient presents with  . Chest Pain  . Shortness of Breath     (Consider location/radiation/quality/duration/timing/severity/associated sxs/prior Treatment) The history is provided by the patient and medical records. No language interpreter was used.     Todd Riggs is a 30 y.o. male  with a hx of anxiety and panic attacks presents to the Emergency Department complaining of intermittent left sided chest pain onset 2-3 days ago. Pt reports he has 1 episode per day for the last few days.  Pt reports the episode today began at 6pm while he was at work.  He described the pain as a needle stabbing the left side of his chest.  During the episode he had associated SOB, lightheadedness and intermittent paresthesias of the bilateral hands - all of which resolved within 10 minutes. Pt states he was lifting some heavy boxes when it happened.  Pt reports he is chest pain free at this time and has had no further chest pain since the episode today.  Pt denies aggravating or alleviating factors for his CP.  Pt reports other episodes of SOB without chest pain.  Pt reports a hx of panic attacks and reports he has learned to control it without medication.  He reports today's episode was different, but is unable to quantify this for me.  Pt reports occasional, social EtOH usage.  He reports 3 nights ago he went out with friends and did a lot of shots of liquor and the episodes began the day after.  Denies fever, chills, headache, neck pain, chest pain, SOB, abd pain, N/V/D, weakness, dizziness, syncope, dysuria, penile discharge.  Pt reports he is sexually active with 1 male partner, however this has only been 1.5 weeks ago.  Pt denies family hx of cardiac disease and no sudden death before the age of 85.  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     Past Medical History  Diagnosis Date  . Anxiety   . Panic attack    Past Surgical History  Procedure Laterality Date  . Ruptured kidney     No family history on file. Social History  Substance Use Topics  . Smoking status: Current Some Day Smoker    Last Attempt to Quit: 10/05/2014  . Smokeless tobacco: Never Used  . Alcohol Use: Yes     Comment: Socially    Review of Systems  Constitutional: Negative for fever, diaphoresis, appetite change, fatigue and unexpected weight change.  HENT: Negative for mouth sores.   Eyes: Negative for visual disturbance.  Respiratory: Positive for shortness of breath. Negative for cough, chest tightness and wheezing.   Cardiovascular: Positive for chest pain.  Gastrointestinal: Negative for nausea, vomiting, abdominal pain, diarrhea and constipation.  Endocrine: Negative for polydipsia, polyphagia and polyuria.  Genitourinary: Negative for dysuria, urgency, frequency and hematuria.  Musculoskeletal: Negative for back pain and neck stiffness.  Skin: Negative for rash.  Allergic/Immunologic: Negative for immunocompromised state.  Neurological: Positive for light-headedness. Negative for syncope and headaches.       Paresthesias  Hematological: Does not bruise/bleed easily.  Psychiatric/Behavioral: Negative for sleep disturbance. The patient is not nervous/anxious.       Allergies  Lactose intolerance (gi)  Home Medications   Prior to Admission  medications   Medication Sig Start Date End Date Taking? Authorizing Provider  Cetirizine HCl (ZYRTEC ALLERGY) 10 MG CAPS Take 1 capsule (10 mg total) by mouth daily. Patient not taking: Reported on 01/24/2015 12/08/14   Elpidio AnisShari Upstill, PA-C  clotrimazole-betamethasone (LOTRISONE) cream Apply to affected area 2 times daily prn Patient not taking: Reported on 08/18/2015 01/24/15   Elpidio AnisShari Upstill, PA-C  ibuprofen (ADVIL,MOTRIN) 800 MG tablet Take 1 tablet (800 mg  total) by mouth 3 (three) times daily. Patient not taking: Reported on 08/18/2015 01/24/15   Elpidio AnisShari Upstill, PA-C  LORazepam (ATIVAN) 0.5 MG tablet Take 2 tablets (1 mg total) by mouth every 8 (eight) hours as needed for anxiety. Patient not taking: Reported on 12/08/2014 10/26/14   Elpidio AnisShari Upstill, PA-C  naproxen (NAPROSYN) 500 MG tablet Take 1 tablet (500 mg total) by mouth 2 (two) times daily as needed for mild pain, moderate pain or headache (TAKE WITH MEALS.). Patient not taking: Reported on 07/01/2014 05/02/14   Mercedes Camprubi-Soms, PA-C   BP 122/80 mmHg  Pulse 90  Temp(Src) 98.2 F (36.8 C) (Oral)  Resp 20  SpO2 99% Physical Exam  Constitutional: He appears well-developed and well-nourished. No distress.  Awake, alert, nontoxic appearance  HENT:  Head: Normocephalic and atraumatic.  Mouth/Throat: Oropharynx is clear and moist. No oropharyngeal exudate.  Eyes: Conjunctivae are normal. No scleral icterus.  Neck: Normal range of motion. Neck supple.  Cardiovascular: Regular rhythm, normal heart sounds and intact distal pulses.  Bradycardia present.   No murmur heard. Pulses:      Radial pulses are 2+ on the right side, and 2+ on the left side.       Dorsalis pedis pulses are 2+ on the right side, and 2+ on the left side.  Capillary refill < 3 sec  Pulmonary/Chest: Effort normal and breath sounds normal. No respiratory distress. He has no wheezes.  Equal chest expansion  Abdominal: Soft. Bowel sounds are normal. He exhibits no mass. There is no tenderness. There is no rebound and no guarding.  Soft and nontender  Musculoskeletal: Normal range of motion. He exhibits no edema.  Neurological: He is alert.  Speech is clear and goal oriented Moves extremities without ataxia  Skin: Skin is warm and dry. He is not diaphoretic.  Psychiatric: He has a normal mood and affect.  Nursing note and vitals reviewed.   ED Course  Procedures (including critical care time) Labs Review Labs  Reviewed  BASIC METABOLIC PANEL - Abnormal; Notable for the following:    Glucose, Bld 108 (*)    BUN 22 (*)    Creatinine, Ser 1.46 (*)    All other components within normal limits  CBC - Abnormal; Notable for the following:    HCT 38.7 (*)    All other components within normal limits  I-STAT TROPOININ, ED    Imaging Review Dg Chest 2 View  08/17/2015  CLINICAL DATA:  30 year old male with chest pain EXAM: CHEST  2 VIEW COMPARISON:  Radiograph dated 01/06/2015 FINDINGS: The heart size and mediastinal contours are within normal limits. Both lungs are clear. The visualized skeletal structures are unremarkable. IMPRESSION: No active cardiopulmonary disease. Electronically Signed   By: Elgie CollardArash  Radparvar M.D.   On: 08/17/2015 22:48   I have personally reviewed and evaluated these images and lab results as part of my medical decision-making.    ED ECG REPORT   Date: 08/18/2015  Rate: 62  Rhythm: normal sinus rhythm  QRS Axis: normal  Intervals: normal  ST/T Wave abnormalities: normal  Conduction Disutrbances:none  Narrative Interpretation: unchanged from 01/06/2015  Old EKG Reviewed: unchanged  I have personally reviewed the EKG tracing and agree with the computerized printout as noted.   MDM   Final diagnoses:  Chest pain, unspecified chest pain type  Elevated serum creatinine  Elevated BUN   Dondra Spry presents with chest pain, intermittent with 1 episode each day for the last 3 days.   Chest pain is not likely of cardiac or pulmonary etiology d/t presentation, PERC negative, VSS, no tracheal deviation, no JVD or new murmur, RRR, breath sounds equal bilaterally, EKG without acute abnormalities, negative troponin, and negative CXR. Pt has been advised to return to the ED if CP becomes exertional, associated with diaphoresis or nausea, radiates to left jaw/arm, worsens or becomes concerning in any way.   Of note patient's serum creatinine is elevated to 1.4 today with an  elevation in BUN to 22. Patient's last creatinine measurement was 1.3 in May 2016. Discussed my concerns with patient about this elevated level. He's not having itching or rash. He does endorse some fatigue. Patient reports several months ago he began taking a new workout supplement which he does not know the name of. Concern that this may be attributing to the rise in serum creatinine and BUN. Patient also endorses only minimal water intake on a regular basis. Discussed my concerns with patient about elevated serum creatinine and what that may indicate for him. I have requested that he follow-up with his primary care physician within 3-5 days for recheck of his labs to ensure improvement. Patient has been given a resource guide and instructions on how to present to the urgent care if this is more convenient. Also discussed reasons to return immediately to the emergency department. Pt appears reliable for follow up and is agreeable to discharge.    Dahlia Client Buryl Bamber, PA-C 08/18/15 0544  Dione Booze, MD 08/18/15 [phone]

## 2015-08-18 NOTE — Discharge Instructions (Signed)
1. Medications: usual home medications 2. Treatment: rest, drink plenty of water 3. Follow Up: Please followup with your primary doctor in 3-5 days for repeat lab work, discussion of your diagnoses and further evaluation after today's visit; if you do not have a primary care doctor use the resource guide provided to find one; Please return to the ER for worsening symptoms, worsening or persistent chest pain, chest pain associated with nausea, diaphoresis or syncope, other concerns     Emergency Department Resource Guide 1) Find a Doctor and Pay Out of Pocket Although you won't have to find out who is covered by your insurance plan, it is a good idea to ask around and get recommendations. You will then need to call the office and see if the doctor you have chosen will accept you as a new patient and what types of options they offer for patients who are self-pay. Some doctors offer discounts or will set up payment plans for their patients who do not have insurance, but you will need to ask so you aren't surprised when you get to your appointment.  2) Contact Your Local Health Department Not all health departments have doctors that can see patients for sick visits, but many do, so it is worth a call to see if yours does. If you don't know where your local health department is, you can check in your phone book. The CDC also has a tool to help you locate your state's health department, and many state websites also have listings of all of their local health departments.  3) Find a Walk-in Clinic If your illness is not likely to be very severe or complicated, you may want to try a walk in clinic. These are popping up all over the country in pharmacies, drugstores, and shopping centers. They're usually staffed by nurse practitioners or physician assistants that have been trained to treat common illnesses and complaints. They're usually fairly quick and inexpensive. However, if you have serious medical issues  or chronic medical problems, these are probably not your best option.  No Primary Care Doctor: - Call Health Connect at  [phone] - they can help you locate a primary care doctor that  accepts your insurance, provides certain services, etc. - Physician Referral Service- [phone]  Chronic Pain Problems: Organization         Address  Phone   Notes  [facility]  [phone]) [phone] Patients need to be referred by their primary care doctor.   Medication Assistance: Organization         Address  Phone   Notes  [facility] Medication [facility] [address] [place], [place] [address] [phone] --Must be a resident of [place] -- Must have NO insurance coverage whatsoever (no Medicaid/ Medicare, etc.) -- The pt. MUST have a primary care doctor that directs their care regularly and follows them in the community   MedAssist  [phone]) [phone]   [employer]  [phone]    Agencies that provide inexpensive medical care: Organization         Address  Phone   Notes  [facility]  [phone]) [phone]   [facility]    [phone]) [phone]   [facility] [address] [place], [place] [address] [phone]   [facility] [place] [address], [place] [phone]   [facility]anned Parenthood    [phone](336) [phone]   [facility]    [phone]   [address], [place] Phone:  [phone], Fax:  [phone] Hours of Operation:  9 am - 6 pm, M-F.  Also accepts Medicaid/Medicare and self-pay.  [facility] [place], [address], [place] Phone: [phone], Fax: [phone]. Hours of Operation:  8:30 am - 5:30 pm, M-F.  Also accepts Medicaid and self-pay.  [facility] [address] [place] Phone: [phone]   [place], [place], [place]  [phone], Ext. [phone] Mondays & Thursdays: 7-9 AM.  First 15 patients are seen on a first come, first serve basis.    [place] Providers:  Organization         Address  Phone   Notes  [facility] [address], [place] [phone] Also accepts self-pay patients.  [facility] [address], [place]  [phone]   [place], [place] [phone]   [facility] [address] [place] [phone]   Lucianne Lei [address], [place]   [phone] Only accepts [place] Access [place] patients after they have their name applied to their card.   Self-Pay (no insurance) in [facility]:  Organization         Address  Phone   Notes  Sickle Cell Patients, [facility] [place] [phone]   [facility] [place] [phone]   [facility] [place]  [place], [place], [place] [phone]   [facility]/Dr. Osei-Bonsu  [address], [place] or [address], [place] [phone] Phone number for both [place] and [place] locations is the same.  [facility] [address] [place] [phone]   [facility] [address] [place] or [address] [phone]   [facility] [address] [place] [phone], phone; [phone], fax Sees patients 1st and 3rd Saturday of every month.  Must not qualify for public or private insurance (i.e. Medicaid, Medicare, [place] Health Choice, Veterans' Benefits)  Household income should be no more than 200% of the poverty level The clinic cannot treat you if you are pregnant or think you are pregnant  Sexually transmitted  diseases are not treated at the clinic.    Dental Care: Organization         Address  Phone  Notes  [facility] [phone] Accepts children up to age 10 who are enrolled in [place] or [place]; pregnant women with a Medicaid card; and children who have applied for Medicaid or [place] Health Choice, but were declined, whose parents can pay a reduced fee at time of service.  [facility]  [address], [place] [phone] Accepts children up to age 22 who are enrolled in [place] or [place]; pregnant women with a Medicaid card; and children who have applied for Medicaid or [place] Health Choice, but were declined, whose parents can pay a reduced fee at time of service.  [place]  Access PROGRAM  [address] Rivera [place] [phone] Patients are seen by appointment only. Walk-ins are not accepted. [place] will see patients 57 years of age and older. Monday - Tuesday (8am-5pm) Most Wednesdays (8:30-5pm) $30 per visit, cash only  [facility] Access PROGRAM  [address], [place] [facility] [phone] Patients are seen by appointment only. Walk-ins are not accepted. [place] will see patients 61 years of age and older. One Wednesday Evening (Monthly: Volunteer Based).  $30 per visit, cash only  [place]  [phone] for adults; Children under age 78, call Graduate Pediatric Dentistry at [phone]. Children aged 60-14, please call [phone] to request a pediatric application.  Dental services are provided in all areas of dental care including fillings, crowns and bridges, complete and partial dentures, implants, gum treatment, root canals, and extractions. Preventive care is also provided. Treatment is provided to both adults and children. Patients are selected via a  lottery and there is often a waiting list.   [facility] [address] [place]  [phone] [email or web address]   [facility] [address] [place], [place] [phone], Ext. [phone] Second and Fourth Thursday of each month, opens at 6:30 AM; Clinic ends at 9 AM.  Patients are seen on a first-come first-served basis, and a limited number are seen during each clinic.   [facility]  [address] Hillard Danker [place], [place] [phone]   Eligibility Requirements You must have lived in [place], [place], or [place] counties for at least the last three months.   You cannot be eligible for state or federal sponsored [employer], including [employer], [place], or [employer].   You generally cannot be eligible for healthcare insurance through your employer.    How to apply: Eligibility screenings are held every Tuesday and Wednesday afternoon from 1:00 pm until 4:00 pm. You do not need an appointment for the interview!  [facility] [address], [place], [place]   [place]  [place] Department  [place]  [phone]    Behavioral Health Resources in the Community: Intensive Outpatient Programs Organization         Address  Phone  Notes  [place], [place], [place] [phone]   [facility] Outpatient [address], [place], [place]   ADS: Alcohol & Drug Svcs [address], [place], [place]   [place] [address]  [place], [place] or [phone]   Substance Abuse Resources Organization         Address  Phone  Notes  Alcohol and Drug Services  [phone]   [place]  [phone]   [facility] [place]   Chinita Pester  [phone]   Residential &  Outpatient Substance Abuse Program  [phone]   Psychological Services Organization         Address  Phone  Notes  [facility] [place]  [place]  [phone]   [place] [address] [place] or [phone]    Mobile Crisis Teams Organization         Address  Phone  Notes  [facility], Mobile Crisis Care Unit  [phone]   [facility]  [address] [place],  [place] [address] [place] [phone]    Self-Help/Support Groups Organization         Address  Phone             Notes  [facility] [place] - variety of support groups  [place] Call for more information  Narcotics Anonymous (NA), [facility]aring [facility] [address], [employer] [place]  2 meetings at this location   Special educational needs teacher         Address  Phone  Notes  ASAP Residential Treatment [place],    [place]  [phone]   [facility]  [address] [place] [phone], [place], [place]   [place], [place] [phone] Admissions: 8am-3pm M-F  Incentives Substance [place] [address],    [place], [place] [phone]   [facility] [address] [place], [place], [place]   [facility] [address],  [place], [place]   [facility] Programs - Intensive Outpatient [address] Kristeen [address], [place], [place]   [facility] ([place] [address].,  [place], [place] [phone] or [phone]   [facility] Services (RTS) [address], [place], [place] Accepts Medicaid  [facility] [place] [address],  [place] [phone] Substance Abuse/Addiction Treatment   [facility] Organization          Address  Phone  Notes  [facility]  [phone]   Domenic Schwab, PhD [address] Arlis Porta [place], [place]   [phone] or [phone]   [place], [place] [phone]   [facility] [address], [place], [place] [phone] Insurance/Medicaid/sponsorship through [facility] and Families [address]                                    [place], [place] [phone] [place] [address] [place], [place] [phone]    Dr. Adele Schilder  [phone]   [facility] Dept. 1) [address], [place] 2) [place] 3)  [place] [address], [place] [phone]  [phone]   [place] [phone] or [phone] (After Hours)

## 2015-12-31 ENCOUNTER — Emergency Department (HOSPITAL_COMMUNITY)
Admission: EM | Admit: 2015-12-31 | Discharge: 2015-12-31 | Disposition: A | Discharge status: Home / Self Care | Payer: Managed Care, Other (non HMO) | Attending: Emergency Medicine | Admitting: Emergency Medicine

## 2015-12-31 ENCOUNTER — Encounter (HOSPITAL_COMMUNITY): Payer: Self-pay | Admitting: *Deleted

## 2015-12-31 DIAGNOSIS — M79605 Pain in left leg: Secondary | ICD-10-CM

## 2015-12-31 DIAGNOSIS — F17210 Nicotine dependence, cigarettes, uncomplicated: Secondary | ICD-10-CM | POA: Insufficient documentation

## 2015-12-31 DIAGNOSIS — M79662 Pain in left lower leg: Secondary | ICD-10-CM | POA: Insufficient documentation

## 2015-12-31 MED ORDER — NAPROXEN 375 MG PO TABS: 375.0000 mg | ORAL_TABLET | Freq: Two times a day (BID) | ORAL | Status: AC

## 2015-12-31 MED ORDER — NAPROXEN 375 MG PO TABS
375.0000 mg | ORAL_TABLET | Freq: Once | ORAL | Status: DC
Start: 2015-12-31 — End: 2015-12-31
  Filled 2015-12-31: qty 1

## 2015-12-31 NOTE — Discharge Instructions (Signed)

## 2015-12-31 NOTE — ED Provider Notes (Signed)
CSN: [id number]     Arrival date & time 12/31/15  [id number] History   First MD Initiated Contact with Patient 12/31/15 [phone]     Chief Complaint  Patient presents with  . Leg Pain     (Consider location/radiation/quality/duration/timing/severity/associated sxs/prior Treatment) Patient is a 30 y.o. male presenting with leg pain. The history is provided by the patient.  Leg Pain Location:  Leg Injury: no   Leg location:  L leg and L lower leg Pain details:    Quality:  Aching   Radiates to:  Does not radiate   Severity:  Moderate   Onset quality:  Gradual   Duration:  2 days   Timing:  Constant   Progression:  Unchanged Chronicity:  New Dislocation: no   Foreign body present:  No foreign bodies Prior injury to area:  No Relieved by:  Nothing Worsened by:  Nothing tried Ineffective treatments:  None tried Associated symptoms: no back pain, no fatigue, no numbness, no stiffness, no swelling and no tingling   Risk factors: no concern for non-accidental trauma   works on his feet and is on legs all the time and left shin and anterior knee hurt.  No surgerys no swelling no log car trips or plane trips  Past Medical History  Diagnosis Date  . Anxiety   . Panic attack    Past Surgical History  Procedure Laterality Date  . Ruptured kidney     No family history on file. Social History  Substance Use Topics  . Smoking status: Current Some Day Smoker -- 0.25 packs/day    Types: Cigarettes  . Smokeless tobacco: Never Used  . Alcohol Use: Yes     Comment: Socially    Review of Systems  Constitutional: Negative for fatigue.  Musculoskeletal: Negative for back pain and stiffness.  All other systems reviewed and are negative.     Allergies  Lactose intolerance (gi)  Home Medications   Prior to Admission medications   Medication Sig Start Date End Date Taking? Authorizing Provider  naproxen (NAPROSYN) 375 MG tablet Take 1 tablet (375 mg total) by mouth 2 (two) times daily.  12/31/15   Anglea Gordner, MD   BP 132/82 mmHg  Pulse 64  Temp(Src) 97.9 F (36.6 C) (Oral)  Resp 16  Ht 6\' 2"  (1.88 m)  SpO2 100% Physical Exam  Constitutional: He is oriented to person, place, and time. He appears well-developed and well-nourished. No distress.  HENT:  Head: Normocephalic and atraumatic.  Mouth/Throat: Oropharynx is clear and moist.  Eyes: Conjunctivae are normal.  Neck: Normal range of motion. Neck supple.  Cardiovascular: Normal rate, regular rhythm and intact distal pulses.   Pulmonary/Chest: Effort normal and breath sounds normal. No respiratory distress. He has no wheezes. He has no rales.  Abdominal: Soft. Bowel sounds are normal. He exhibits no distension. There is no tenderness. There is no rebound.  Musculoskeletal: Normal range of motion. He exhibits no edema or tenderness.       Left hip: Normal.       Left knee: He exhibits abnormal patellar mobility. He exhibits normal range of motion, no swelling, no effusion, no ecchymosis, no deformity, no laceration, normal alignment, no LCL laxity, no bony tenderness, normal meniscus and no MCL laxity. No tenderness found. No medial joint line, no lateral joint line, no MCL, no LCL and no patellar tendon tenderness noted.       Left ankle: Normal. Achilles tendon normal.       Left  lower leg: Normal.  No cords no calf tenderness negative homans sign  Neurological: He is alert and oriented to person, place, and time. He has normal reflexes.  Skin: Skin is warm and dry.  Psychiatric: He has a normal mood and affect.    ED Course  Procedures (including critical care time) Labs Review Labs Reviewed - No data to display  Imaging Review No results found. I have personally reviewed and evaluated these images and lab results as part of my medical decision-making.   EKG Interpretation None      MDM   Final diagnoses:  Pain of left lower extremity   Filed Vitals:   12/31/15 0346  BP: 132/82  Pulse: 64   Temp: 97.9 F (36.6 C)  Resp: 16    NSAIDs and ice follow up with your PMD.      Kohl Polinsky, MD 12/31/15 [phone]

## 2015-12-31 NOTE — ED Notes (Signed)
MD at bedside. 

## 2015-12-31 NOTE — ED Notes (Addendum)
Pt c/o rt shin pain x 48hrs; pt states pain is worse when walk and movement; pt denies injury to leg;  Pt asleep upon entering room to triage

## 2016-04-02 ENCOUNTER — Emergency Department (HOSPITAL_COMMUNITY): Payer: Managed Care, Other (non HMO)

## 2016-04-02 ENCOUNTER — Encounter (HOSPITAL_COMMUNITY): Payer: Self-pay | Admitting: Emergency Medicine

## 2016-04-02 ENCOUNTER — Emergency Department (HOSPITAL_COMMUNITY)
Admission: EM | Admit: 2016-04-02 | Discharge: 2016-04-02 | Disposition: A | Discharge status: Home / Self Care | Payer: Managed Care, Other (non HMO) | Attending: Emergency Medicine | Admitting: Emergency Medicine

## 2016-04-02 DIAGNOSIS — J069 Acute upper respiratory infection, unspecified: Secondary | ICD-10-CM | POA: Insufficient documentation

## 2016-04-02 DIAGNOSIS — Z87891 Personal history of nicotine dependence: Secondary | ICD-10-CM | POA: Insufficient documentation

## 2016-04-02 LAB — RAPID STREP SCREEN (MED CTR MEBANE ONLY): STREPTOCOCCUS, GROUP A SCREEN (DIRECT): NEGATIVE

## 2016-04-02 MED ORDER — AZITHROMYCIN 250 MG PO TABS: 1000.0000 mg | ORAL_TABLET | Freq: Once | ORAL | Status: DC

## 2016-04-02 MED ORDER — CEFTRIAXONE SODIUM 250 MG IJ SOLR: 250.0000 mg | Freq: Once | INTRAMUSCULAR | Status: DC

## 2016-04-02 NOTE — Discharge Instructions (Signed)
For pain control please take ibuprofen (also known as Motrin or Advil) 800mg  (this is normally 4 over the counter pills) 3 times a day  for 5 days. Take with food to minimize stomach irritation.  Return to the emergency room for any worsening or concerning symptoms including fast breathing, heart racing, confusion, vomiting.  Rest, cover your mouth when you cough and wash your hands frequently.   Push fluids: water or Gatorade, do not drink any soda, juice or caffeinated beverages.  Do not return to work until a day after your fever breaks.

## 2016-04-02 NOTE — ED Notes (Signed)
PA at bedside.

## 2016-04-02 NOTE — ED Provider Notes (Signed)
[facility]-EMERGENCY DEPT Provider Note   CSN: [id number] Arrival date & time: 04/02/16  4098     History   Chief Complaint Chief Complaint  Patient presents with  . Shortness of Breath  . Sore Throat    HPI  Blood pressure 134/78, pulse (!) 56, temperature 97.6 F (36.4 C), resp. rate 18, height 6\' 3"  (1.905 m), weight 98.9 kg, SpO2 100 %.  Todd Riggs is a 30 y.o. male  complaining of productive cough, sore throat, rhinorrhea, fatigue worsening over the course of 4 days. Patient also endorses a general anterior pleuritic chest pain he denies fever, chills, hemoptysis, history of DVT/PE, calf pain, recent immobilization or leg swelling, , vomiting, change in bowel or bladder habits. 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 States that he is trying to stay hydrated over the shift, does not have a headache right now.  HPI  Past Medical History:  Diagnosis Date  . Anxiety   . Panic attack     There are no active problems to display for this patient.   Past Surgical History:  Procedure Laterality Date  . ruptured kidney         Home Medications    Prior to Admission medications   Medication Sig Start Date End Date Taking? Authorizing Provider  naproxen (NAPROSYN) 375 MG tablet Take 1 tablet (375 mg total) by mouth 2 (two) times daily. Patient not taking: Reported on 04/02/2016 12/31/15   April Palumbo, MD    Family History No family history on file.  Social History Social History  Substance Use Topics  . Smoking status: Former Smoker    Packs/day: 0.25    Types: Cigarettes  . Smokeless tobacco: Never Used  . Alcohol use Yes     Comment: Socially     Allergies   Lactose intolerance (gi)   Review of Systems Review of Systems  10 systems reviewed and found to be negative, except as noted in the  HPI.   Physical Exam Updated Vital Signs BP 134/78 (BP Location: Left Arm)   Pulse (!) 56   Temp 97.6 F (36.4 C)   Resp 18   Ht 6\' 3"  (1.905 m)   Wt 98.9 kg   SpO2 100%   BMI 27.25 kg/m   Physical Exam  Constitutional: He is oriented to person, place, and time. He appears well-developed and well-nourished. No distress.  HENT:  Head: Normocephalic and atraumatic.  Right Ear: External ear normal.  Left Ear: External ear normal.  Mouth/Throat: Oropharynx is clear and moist. No oropharyngeal exudate.  No drooling or stridor. Posterior pharynx mildly erythematous no significant tonsillar hypertrophy. No exudate. Soft palate rises symmetrically. No TTP or induration under tongue.   No tenderness to palpation of frontal or bilateral maxillary sinuses.  Mild mucosal edema in the nares with scant rhinorrhea.  Bilateral tympanic membranes with normal architecture and good light reflex.    Eyes: Conjunctivae and EOM are normal. Pupils are equal, round, and reactive to light.  Neck: Normal range of motion. Neck supple. No JVD present. No tracheal deviation present.  Cardiovascular: Normal rate, regular rhythm and intact distal pulses.   Radial pulse equal bilaterally  Pulmonary/Chest: Effort normal and breath sounds normal. No stridor. No respiratory distress. He has no wheezes. He has no rales. He exhibits  no tenderness.  Abdominal: Soft. He exhibits no distension and no mass. There is no tenderness. There is no rebound and no guarding.  Musculoskeletal: Normal range of motion. He exhibits no edema or tenderness.  No calf asymmetry, superficial collaterals, palpable cords, edema, Homans sign negative bilaterally.    Neurological: He is alert and oriented to person, place, and time.  Skin: Skin is warm. He is not diaphoretic.  Psychiatric: He has a normal mood and affect.  Nursing note and vitals reviewed.    ED Treatments / Results  Labs (all labs ordered are listed, but only  abnormal results are displayed) Labs Reviewed  RAPID STREP SCREEN (NOT AT [facility])  CULTURE, GROUP A STREP ([facility])  GC/CHLAMYDIA PROBE AMP ([facility]) NOT AT [facility]    EKG  EKG Interpretation  Date/Time:  Tuesday April 02 2016 10:20:37 EDT Ventricular Rate:  55 PR Interval:    QRS Duration: 104 QT Interval:  391 QTC Calculation: 374 R Axis:   93 Text Interpretation:  Sinus rhythm Borderline right axis deviation RSR' in V1 or V2, probably normal variant ST elev, probable normal early repol pattern Confirmed by DELO  MD, DOUGLAS ([id number]) on 04/02/2016 10:39:32 AM       Radiology Dg Chest 2 View  Result Date: 04/02/2016 CLINICAL DATA:  Shortness of breath, productive cough, pleuritic chest pain for the past 3-4 days. History of un medicated hypertension, discontinued smoking 1 month ago. EXAM: CHEST  2 VIEW COMPARISON:  PA and lateral chest x-ray of August 17, 2015 FINDINGS: The lungs are well-expanded and clear. The heart and pulmonary vascularity are normal. The mediastinum is normal in width. There is no pleural effusion. The bony thorax is unremarkable. IMPRESSION: There is no evidence of pneumonia nor other active cardiopulmonary disease. Electronically Signed   By: David  [place] M.D.   On: 04/02/2016 10:42    Procedures Procedures (including critical care time)  Medications Ordered in ED Medications - No data to display   Initial Impression / Assessment and Plan / ED Course  I have reviewed the triage vital signs and the nursing notes.  Pertinent labs & imaging results that were available during my care of the patient were reviewed by me and considered in my medical decision making (see chart for details).  Vitals:   04/02/16 1010 04/02/16 1012  BP: 134/78   Pulse: (!) 56   Resp: 18   Temp: 97.6 F (36.4 C)   SpO2: 100%   Weight:  98.9 kg  Height:  6\' 3"  (1.905 m)    Medications - No data to display  Todd Riggs is 30 y.o. male presenting with Productive cough,  pleuritic chest pain, sore throat and rhinorrhea. Patient's lung sounds clear to auscultation, he saturating well on room air, he is afebrile overall very well appearing, chest x-rays without infiltrate. Patient is low risk by Wells criteria and PERC negative. The strep is negative, think this likely a viral URI, encouraged him to push fluids, recommend high-dose ibuprofen for the pleuritic chest pain. Patient is also noting a headache while at work, he works in a foundry, very high temperatures, he states that he stays aggressively hydrated on shift, I recommend he wear an and 95 mask which I provided to him as a trial. He is awaiting implantation of his insurance, will give referral for primary care. Work note provided.  Evaluation does not show pathology that would require ongoing emergent intervention or inpatient treatment. Pt is hemodynamically stable and mentating appropriately.  Discussed findings and plan with patient/guardian, who agrees with care plan. All questions answered. Return precautions discussed and outpatient follow up given.      Final Clinical Impressions(s) / ED Diagnoses   Final diagnoses:  URI (upper respiratory infection)      Wynetta Emeryicole Kelicia Youtz, PA-C 04/02/16 1157    Geoffery Lyonsouglas Delo, MD 04/02/16 [phone]

## 2016-04-02 NOTE — ED Triage Notes (Addendum)
Patient reports shortness of breath and sore throat x2 days. Lung sounds clear upon auscultation. Reports a productive cough with yellow mucous.  Patient alert and oriented x4. Patient speaking in full sentences.

## 2016-04-02 NOTE — ED Notes (Signed)
Bed: WA19 Expected date:  Expected time:  Means of arrival:  Comments: 

## 2016-04-04 LAB — CULTURE, GROUP A STREP (THRC)

## 2016-11-24 IMAGING — CR DG CHEST 2V
2 series · 2 of 2 positions shown · non-contrast
Comparison: PA and lateral chest x-ray August 17, 2015

CLINICAL DATA: Shortness of breath, productive cough, pleuritic
chest pain for the past 3-4 days. History of un medicated
hypertension, discontinued smoking 1 month ago.

EXAM:
CHEST  2 VIEW

[w chest pa]
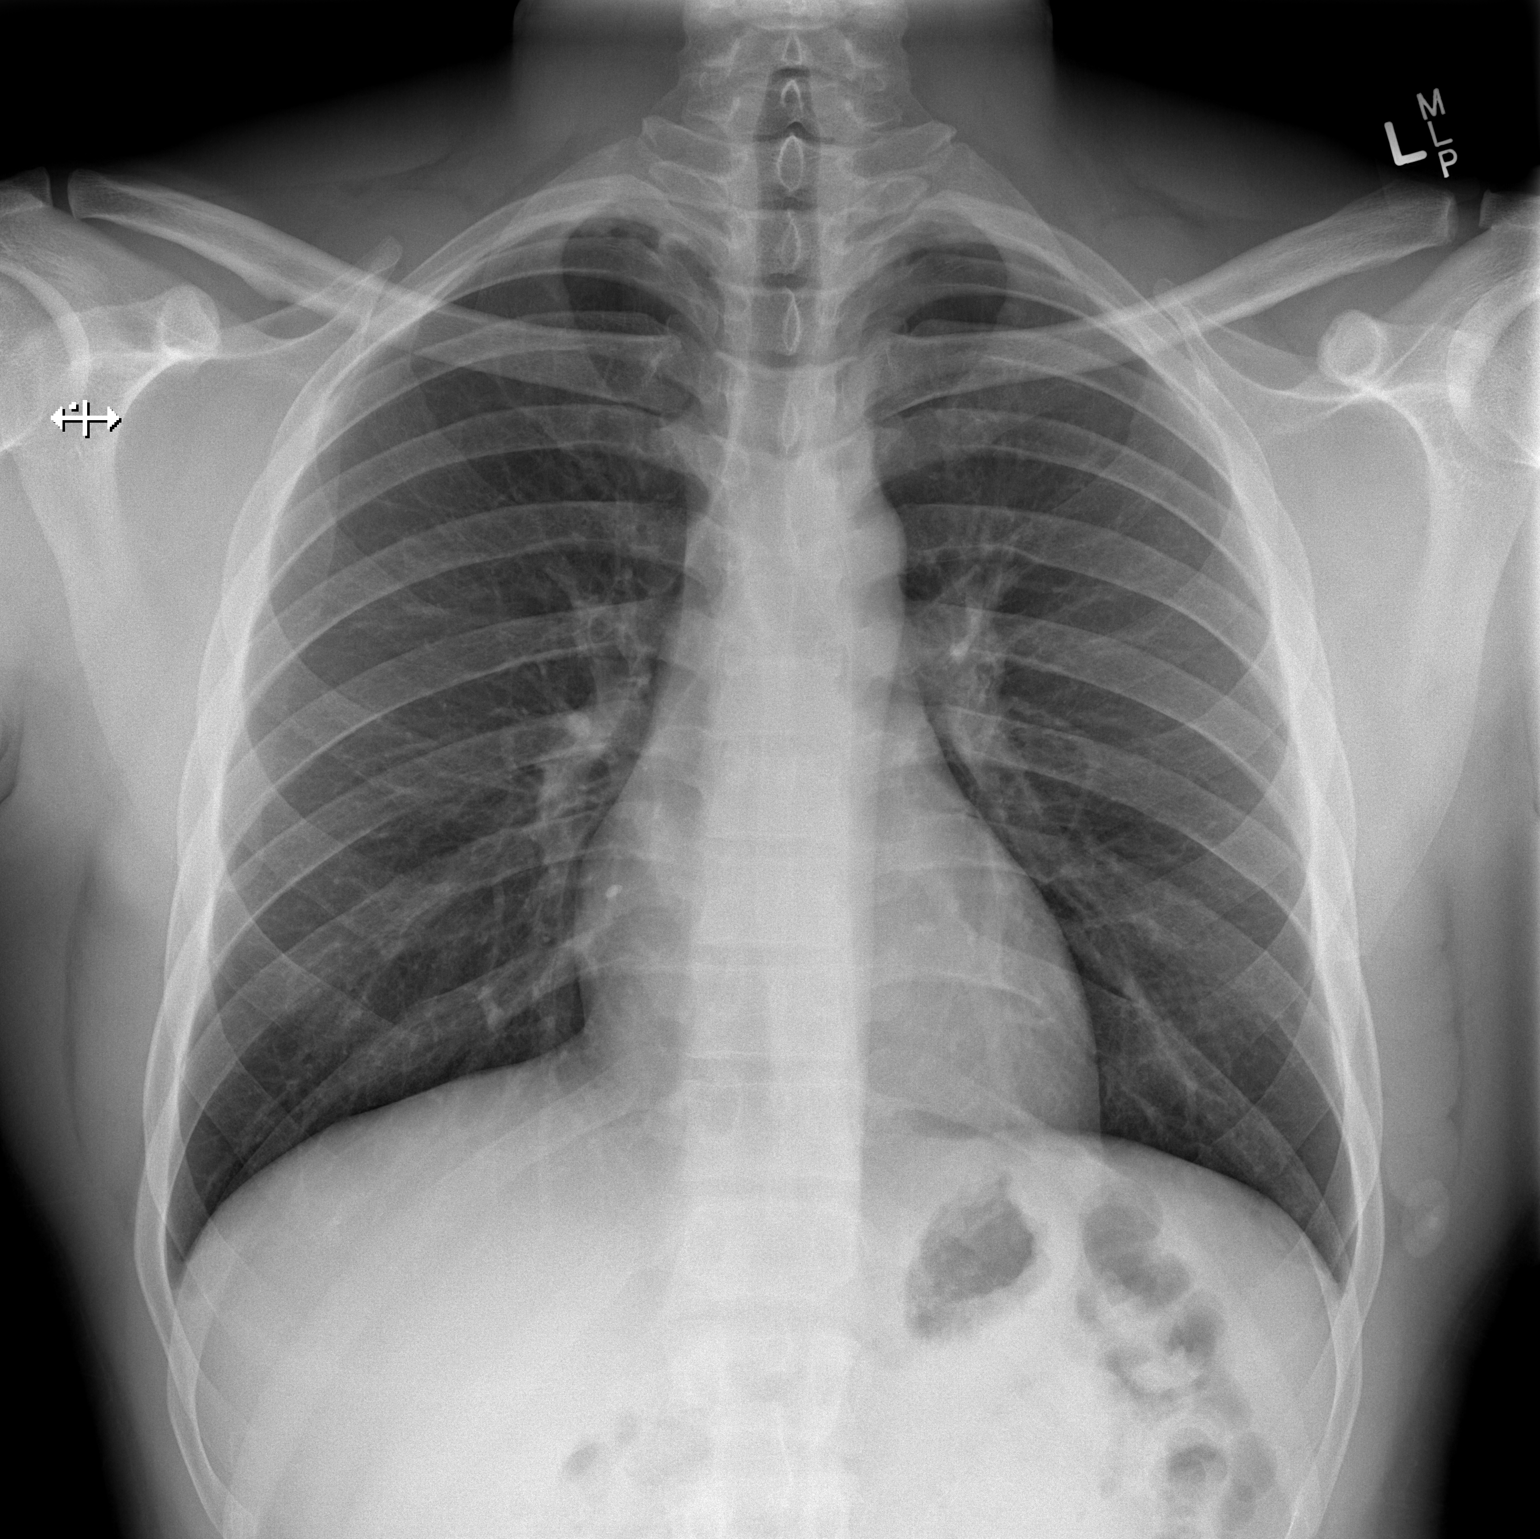

[w chest lat]
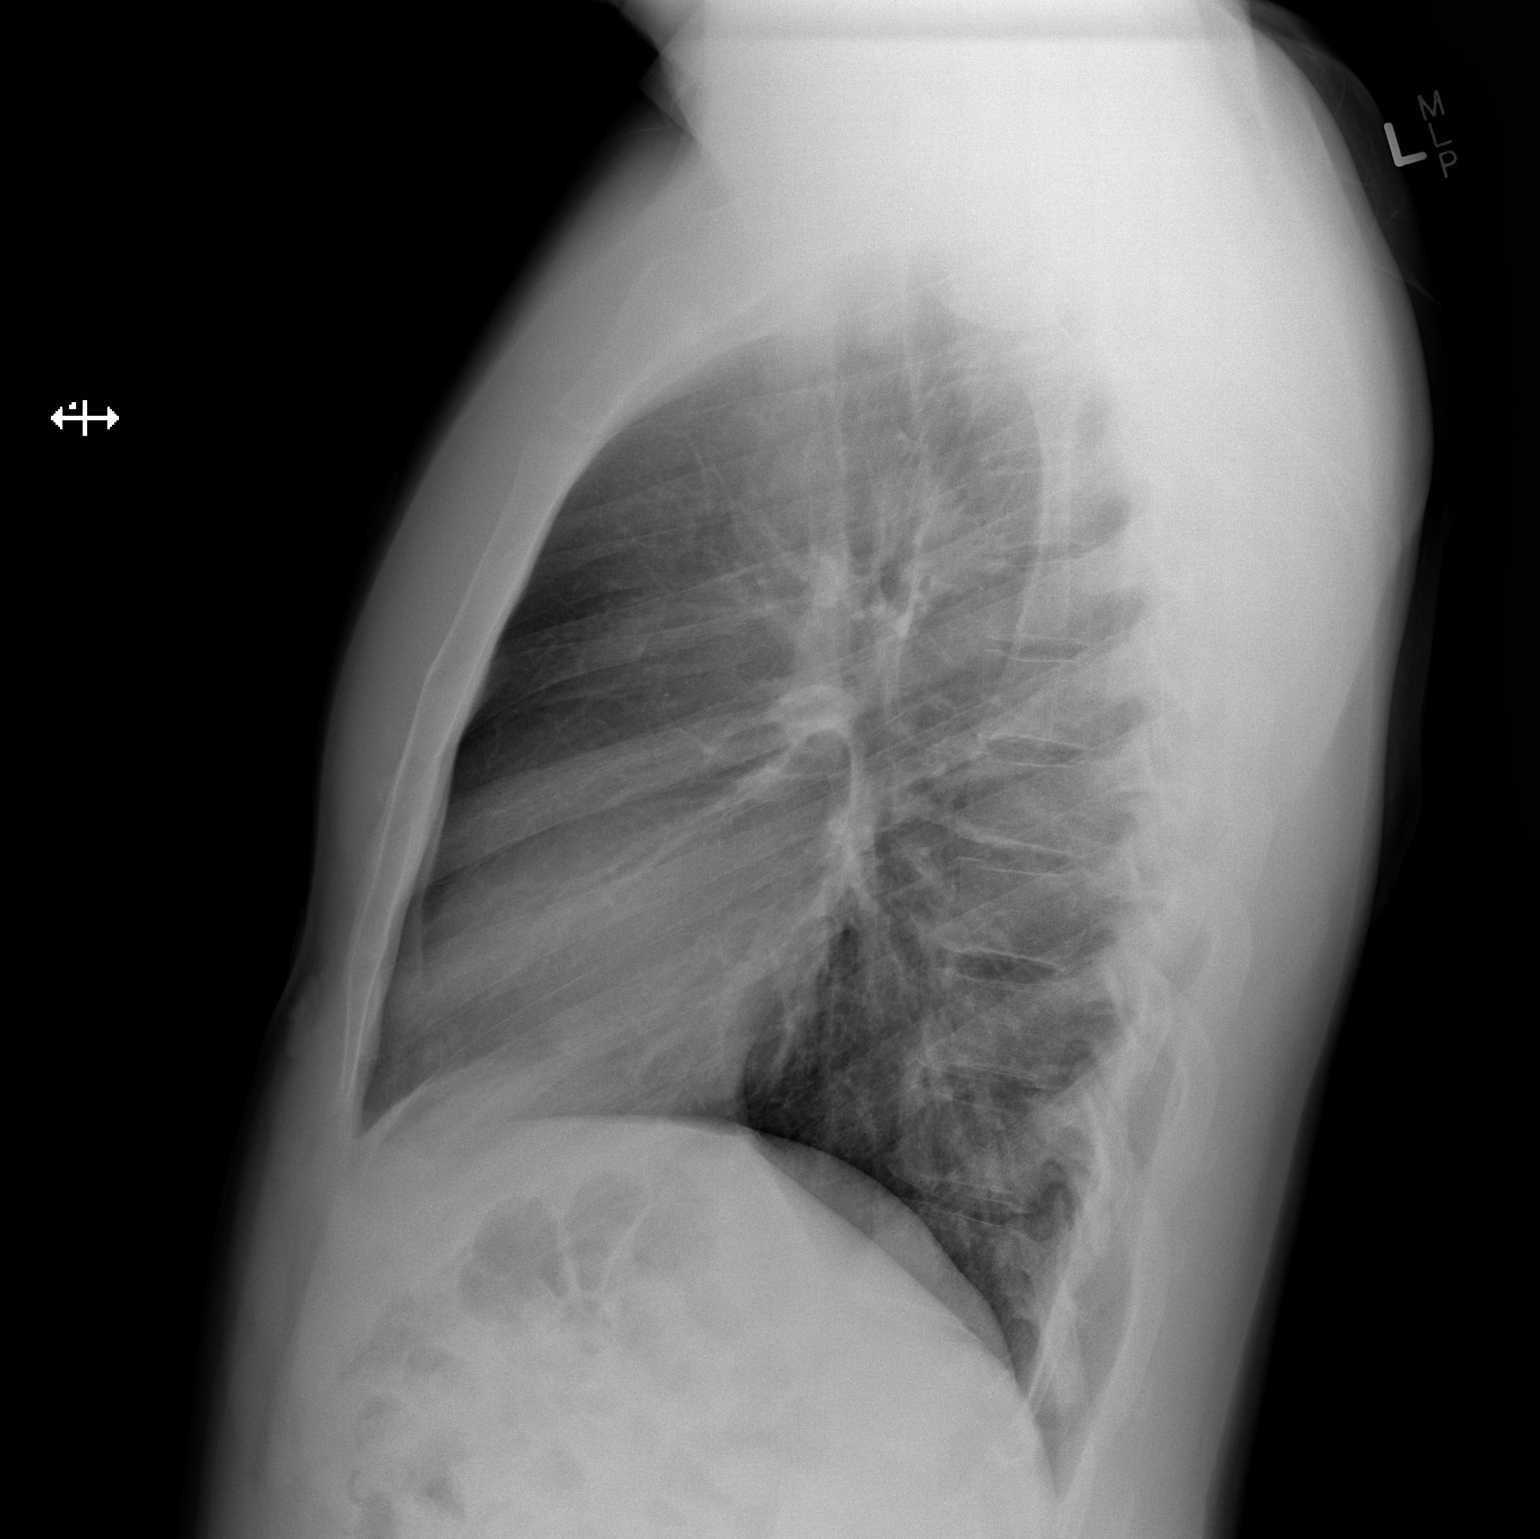

[2 of 2 positions shown; findings below may reference images not displayed]

FINDINGS: The lungs are well-expanded and clear. The heart and pulmonary
vascularity are normal. The mediastinum is normal in width. There is
no pleural effusion. The bony thorax is unremarkable.
IMPRESSION: There is no evidence of pneumonia nor other active cardiopulmonary
disease.
